# Patient Record
Sex: Male | Born: 1935 | Race: Black or African American | Hispanic: No | Marital: Married | Smoking: Former smoker
Health system: Southern US, Community
[De-identification: ages and names within clinical notes are randomized; demographics above are authoritative.]

## PROBLEM LIST (undated history)

## (undated) DIAGNOSIS — K5900 Constipation, unspecified: Secondary | ICD-10-CM

## (undated) DIAGNOSIS — I2510 Atherosclerotic heart disease of native coronary artery without angina pectoris: Secondary | ICD-10-CM

## (undated) DIAGNOSIS — Z950 Presence of cardiac pacemaker: Secondary | ICD-10-CM

## (undated) DIAGNOSIS — M1990 Unspecified osteoarthritis, unspecified site: Secondary | ICD-10-CM

## (undated) DIAGNOSIS — R350 Frequency of micturition: Secondary | ICD-10-CM

## (undated) DIAGNOSIS — N183 Chronic kidney disease, stage 3 unspecified: Secondary | ICD-10-CM

## (undated) DIAGNOSIS — J449 Chronic obstructive pulmonary disease, unspecified: Secondary | ICD-10-CM

## (undated) DIAGNOSIS — F411 Generalized anxiety disorder: Secondary | ICD-10-CM

## (undated) DIAGNOSIS — N400 Enlarged prostate without lower urinary tract symptoms: Secondary | ICD-10-CM

## (undated) DIAGNOSIS — M549 Dorsalgia, unspecified: Secondary | ICD-10-CM

## (undated) DIAGNOSIS — I10 Essential (primary) hypertension: Secondary | ICD-10-CM

## (undated) DIAGNOSIS — J4489 Other specified chronic obstructive pulmonary disease: Secondary | ICD-10-CM

## (undated) DIAGNOSIS — J309 Allergic rhinitis, unspecified: Secondary | ICD-10-CM

## (undated) DIAGNOSIS — J45909 Unspecified asthma, uncomplicated: Secondary | ICD-10-CM

## (undated) DIAGNOSIS — I469 Cardiac arrest, cause unspecified: Secondary | ICD-10-CM

## (undated) DIAGNOSIS — I442 Atrioventricular block, complete: Secondary | ICD-10-CM

## (undated) DIAGNOSIS — K219 Gastro-esophageal reflux disease without esophagitis: Secondary | ICD-10-CM

## (undated) DIAGNOSIS — E785 Hyperlipidemia, unspecified: Secondary | ICD-10-CM

## (undated) HISTORY — DX: Presence of cardiac pacemaker: Z95.0

## (undated) HISTORY — DX: Benign prostatic hyperplasia without lower urinary tract symptoms: N40.0

## (undated) HISTORY — DX: Hyperlipidemia, unspecified: E78.5

## (undated) HISTORY — DX: Generalized anxiety disorder: F41.1

## (undated) HISTORY — DX: Allergic rhinitis, unspecified: J30.9

## (undated) HISTORY — PX: PACEMAKER INSERTION: SHX728

## (undated) HISTORY — DX: Chronic obstructive pulmonary disease, unspecified: J44.9

## (undated) HISTORY — DX: Atrioventricular block, complete: I44.2

## (undated) HISTORY — PX: CERVICAL FUSION: SHX112

## (undated) HISTORY — DX: Unspecified asthma, uncomplicated: J45.909

## (undated) HISTORY — DX: Chronic kidney disease, stage 3 (moderate): N18.3

## (undated) HISTORY — DX: Unspecified osteoarthritis, unspecified site: M19.90

## (undated) HISTORY — DX: Frequency of micturition: R35.0

## (undated) HISTORY — DX: Chronic kidney disease, stage 3 unspecified: N18.30

## (undated) HISTORY — DX: Constipation, unspecified: K59.00

## (undated) HISTORY — DX: Gastro-esophageal reflux disease without esophagitis: K21.9

## (undated) HISTORY — DX: Other specified chronic obstructive pulmonary disease: J44.89

## (undated) HISTORY — DX: Dorsalgia, unspecified: M54.9

---

## 1998-06-08 ENCOUNTER — Ambulatory Visit (HOSPITAL_COMMUNITY): Admission: RE | Admit: 1998-06-08 | Discharge: 1998-06-08 | Payer: Self-pay | Admitting: Neurosurgery

## 1998-06-08 ENCOUNTER — Encounter: Payer: Self-pay | Admitting: Neurosurgery

## 1998-06-12 ENCOUNTER — Encounter: Payer: Self-pay | Admitting: Neurosurgery

## 1998-06-12 ENCOUNTER — Inpatient Hospital Stay (HOSPITAL_COMMUNITY): Admission: RE | Admit: 1998-06-12 | Discharge: 1998-06-18 | Payer: Self-pay | Admitting: Neurosurgery

## 1998-06-18 ENCOUNTER — Inpatient Hospital Stay (HOSPITAL_COMMUNITY)
Admission: RE | Admit: 1998-06-18 | Discharge: 1998-06-25 | Payer: Self-pay | Admitting: Physical Medicine & Rehabilitation

## 1999-07-24 ENCOUNTER — Encounter: Payer: Self-pay | Admitting: Neurosurgery

## 1999-07-24 ENCOUNTER — Encounter: Admission: RE | Admit: 1999-07-24 | Discharge: 1999-07-24 | Payer: Self-pay | Admitting: Neurosurgery

## 2000-04-22 ENCOUNTER — Encounter: Payer: Self-pay | Admitting: *Deleted

## 2000-04-22 ENCOUNTER — Inpatient Hospital Stay (HOSPITAL_COMMUNITY): Admission: EM | Admit: 2000-04-22 | Discharge: 2000-04-23 | Payer: Self-pay | Admitting: *Deleted

## 2003-12-02 ENCOUNTER — Inpatient Hospital Stay (HOSPITAL_COMMUNITY): Admission: EM | Admit: 2003-12-02 | Discharge: 2003-12-13 | Payer: Self-pay | Admitting: Emergency Medicine

## 2003-12-12 ENCOUNTER — Encounter: Payer: Self-pay | Admitting: Cardiology

## 2004-07-22 ENCOUNTER — Emergency Department (HOSPITAL_COMMUNITY): Admission: EM | Admit: 2004-07-22 | Discharge: 2004-07-22 | Payer: Self-pay | Admitting: Emergency Medicine

## 2004-08-06 ENCOUNTER — Encounter (HOSPITAL_COMMUNITY): Admission: RE | Admit: 2004-08-06 | Discharge: 2004-09-05 | Payer: Self-pay | Admitting: Family Medicine

## 2004-09-11 ENCOUNTER — Encounter (HOSPITAL_COMMUNITY): Admission: RE | Admit: 2004-09-11 | Discharge: 2004-10-11 | Payer: Self-pay | Admitting: Family Medicine

## 2004-10-08 ENCOUNTER — Ambulatory Visit: Payer: Self-pay | Admitting: Internal Medicine

## 2004-11-24 ENCOUNTER — Emergency Department (HOSPITAL_COMMUNITY): Admission: EM | Admit: 2004-11-24 | Discharge: 2004-11-24 | Payer: Self-pay | Admitting: Emergency Medicine

## 2005-03-06 ENCOUNTER — Ambulatory Visit: Payer: Self-pay | Admitting: Cardiology

## 2005-06-19 ENCOUNTER — Encounter (INDEPENDENT_AMBULATORY_CARE_PROVIDER_SITE_OTHER): Payer: Self-pay | Admitting: Family Medicine

## 2005-06-19 LAB — CONVERTED CEMR LAB: PSA: 3.25 ng/mL

## 2005-08-25 ENCOUNTER — Ambulatory Visit: Payer: Self-pay | Admitting: *Deleted

## 2006-02-26 ENCOUNTER — Ambulatory Visit: Payer: Self-pay | Admitting: *Deleted

## 2006-04-23 ENCOUNTER — Ambulatory Visit: Payer: Self-pay | Admitting: Family Medicine

## 2006-05-07 ENCOUNTER — Ambulatory Visit: Payer: Self-pay | Admitting: Family Medicine

## 2006-05-07 LAB — CONVERTED CEMR LAB
RBC count: 4.59 10*6/uL
TSH: 0.857 microintl units/mL
WBC, blood: 3.6 10*3/uL

## 2006-05-12 ENCOUNTER — Ambulatory Visit (HOSPITAL_COMMUNITY): Admission: RE | Admit: 2006-05-12 | Discharge: 2006-05-12 | Payer: Self-pay | Admitting: Family Medicine

## 2006-05-12 ENCOUNTER — Ambulatory Visit: Payer: Self-pay | Admitting: *Deleted

## 2006-05-21 ENCOUNTER — Ambulatory Visit: Payer: Self-pay | Admitting: Family Medicine

## 2006-05-26 ENCOUNTER — Ambulatory Visit: Payer: Self-pay | Admitting: Cardiology

## 2006-06-04 ENCOUNTER — Ambulatory Visit: Payer: Self-pay | Admitting: Family Medicine

## 2006-07-02 ENCOUNTER — Ambulatory Visit: Payer: Self-pay | Admitting: Family Medicine

## 2006-09-11 ENCOUNTER — Encounter: Payer: Self-pay | Admitting: Family Medicine

## 2006-09-11 DIAGNOSIS — K5900 Constipation, unspecified: Secondary | ICD-10-CM | POA: Insufficient documentation

## 2006-09-11 DIAGNOSIS — F411 Generalized anxiety disorder: Secondary | ICD-10-CM | POA: Insufficient documentation

## 2006-09-11 DIAGNOSIS — I10 Essential (primary) hypertension: Secondary | ICD-10-CM | POA: Insufficient documentation

## 2006-09-11 DIAGNOSIS — E785 Hyperlipidemia, unspecified: Secondary | ICD-10-CM

## 2006-09-11 DIAGNOSIS — N400 Enlarged prostate without lower urinary tract symptoms: Secondary | ICD-10-CM | POA: Insufficient documentation

## 2006-09-11 DIAGNOSIS — J309 Allergic rhinitis, unspecified: Secondary | ICD-10-CM | POA: Insufficient documentation

## 2006-09-11 DIAGNOSIS — N183 Chronic kidney disease, stage 3 (moderate): Secondary | ICD-10-CM

## 2006-09-11 DIAGNOSIS — J449 Chronic obstructive pulmonary disease, unspecified: Secondary | ICD-10-CM

## 2006-09-11 DIAGNOSIS — J45909 Unspecified asthma, uncomplicated: Secondary | ICD-10-CM | POA: Insufficient documentation

## 2006-09-11 DIAGNOSIS — J4489 Other specified chronic obstructive pulmonary disease: Secondary | ICD-10-CM | POA: Insufficient documentation

## 2006-09-11 DIAGNOSIS — M1990 Unspecified osteoarthritis, unspecified site: Secondary | ICD-10-CM | POA: Insufficient documentation

## 2006-11-17 ENCOUNTER — Ambulatory Visit: Payer: Self-pay | Admitting: Family Medicine

## 2006-11-17 ENCOUNTER — Telehealth (INDEPENDENT_AMBULATORY_CARE_PROVIDER_SITE_OTHER): Payer: Self-pay | Admitting: Family Medicine

## 2006-11-17 LAB — CONVERTED CEMR LAB
Bilirubin Urine: NEGATIVE
Urobilinogen, UA: 0.2
pH: 7

## 2006-11-18 ENCOUNTER — Encounter (INDEPENDENT_AMBULATORY_CARE_PROVIDER_SITE_OTHER): Payer: Self-pay | Admitting: Family Medicine

## 2006-11-18 LAB — CONVERTED CEMR LAB
ALT: 20 units/L (ref 0–53)
Albumin: 4.6 g/dL (ref 3.5–5.2)
Alkaline Phosphatase: 115 units/L (ref 39–117)
BUN: 20 mg/dL (ref 6–23)
Basophils Relative: 1 % (ref 0–1)
Creatinine, Ser: 1.59 mg/dL — ABNORMAL HIGH (ref 0.40–1.50)
Eosinophils Relative: 7 % — ABNORMAL HIGH (ref 0–5)
Glucose, Bld: 90 mg/dL (ref 70–99)
Hemoglobin: 15 g/dL (ref 13.0–17.0)
Lymphocytes Relative: 31 % (ref 12–46)
Lymphs Abs: 1.2 10*3/uL (ref 0.7–3.3)
MCV: 91.4 fL (ref 78.0–100.0)
Monocytes Relative: 10 % (ref 3–11)
Neutro Abs: 2 10*3/uL (ref 1.7–7.7)
Potassium: 4.5 meq/L (ref 3.5–5.3)
RBC: 5 M/uL (ref 4.22–5.81)
RDW: 14.4 % — ABNORMAL HIGH (ref 11.5–14.0)
Total Bilirubin: 0.7 mg/dL (ref 0.3–1.2)
Total Protein: 8.3 g/dL (ref 6.0–8.3)
WBC: 3.9 10*3/uL — ABNORMAL LOW (ref 4.0–10.5)

## 2006-11-20 ENCOUNTER — Encounter (INDEPENDENT_AMBULATORY_CARE_PROVIDER_SITE_OTHER): Payer: Self-pay | Admitting: Family Medicine

## 2006-11-23 ENCOUNTER — Encounter (INDEPENDENT_AMBULATORY_CARE_PROVIDER_SITE_OTHER): Payer: Self-pay | Admitting: Family Medicine

## 2006-12-01 ENCOUNTER — Ambulatory Visit: Payer: Self-pay | Admitting: Family Medicine

## 2006-12-02 LAB — CONVERTED CEMR LAB: PSA: 2.34 ng/mL (ref 0.10–4.00)

## 2006-12-21 ENCOUNTER — Encounter (INDEPENDENT_AMBULATORY_CARE_PROVIDER_SITE_OTHER): Payer: Self-pay | Admitting: Family Medicine

## 2006-12-28 ENCOUNTER — Encounter (INDEPENDENT_AMBULATORY_CARE_PROVIDER_SITE_OTHER): Payer: Self-pay | Admitting: Family Medicine

## 2007-01-12 ENCOUNTER — Ambulatory Visit: Payer: Self-pay | Admitting: Family Medicine

## 2007-01-12 LAB — CONVERTED CEMR LAB
Cholesterol, target level: 200 mg/dL
HDL goal, serum: 40 mg/dL

## 2007-01-18 ENCOUNTER — Encounter (INDEPENDENT_AMBULATORY_CARE_PROVIDER_SITE_OTHER): Payer: Self-pay | Admitting: Family Medicine

## 2007-01-28 ENCOUNTER — Encounter (INDEPENDENT_AMBULATORY_CARE_PROVIDER_SITE_OTHER): Payer: Self-pay | Admitting: Family Medicine

## 2007-02-02 ENCOUNTER — Encounter (INDEPENDENT_AMBULATORY_CARE_PROVIDER_SITE_OTHER): Payer: Self-pay | Admitting: Family Medicine

## 2007-02-22 ENCOUNTER — Encounter (INDEPENDENT_AMBULATORY_CARE_PROVIDER_SITE_OTHER): Payer: Self-pay | Admitting: Family Medicine

## 2007-02-25 ENCOUNTER — Encounter (INDEPENDENT_AMBULATORY_CARE_PROVIDER_SITE_OTHER): Payer: Self-pay | Admitting: Family Medicine

## 2007-02-25 ENCOUNTER — Ambulatory Visit: Payer: Self-pay | Admitting: Internal Medicine

## 2007-03-09 ENCOUNTER — Ambulatory Visit: Payer: Self-pay | Admitting: Family Medicine

## 2007-03-10 LAB — CONVERTED CEMR LAB
AST: 31 units/L (ref 0–37)
Alkaline Phosphatase: 129 units/L — ABNORMAL HIGH (ref 39–117)
BUN: 21 mg/dL (ref 6–23)
Calcium: 9.7 mg/dL (ref 8.4–10.5)
Eosinophils Absolute: 0.2 10*3/uL (ref 0.0–0.7)
Eosinophils Relative: 7 % — ABNORMAL HIGH (ref 0–5)
Glucose, Bld: 105 mg/dL — ABNORMAL HIGH (ref 70–99)
HCT: 44 % (ref 39.0–52.0)
Hemoglobin: 14.7 g/dL (ref 13.0–17.0)
Lymphocytes Relative: 33 % (ref 12–46)
MCV: 91.5 fL (ref 78.0–100.0)
Monocytes Absolute: 0.3 10*3/uL (ref 0.2–0.7)
Platelets: 169 10*3/uL (ref 150–400)
RBC: 4.81 M/uL (ref 4.22–5.81)
RDW: 14.1 % — ABNORMAL HIGH (ref 11.5–14.0)
Sodium: 139 meq/L (ref 135–145)
Total Bilirubin: 0.4 mg/dL (ref 0.3–1.2)
Total Protein: 8 g/dL (ref 6.0–8.3)

## 2007-03-22 ENCOUNTER — Ambulatory Visit: Payer: Self-pay | Admitting: Family Medicine

## 2007-03-25 ENCOUNTER — Ambulatory Visit: Payer: Self-pay | Admitting: Cardiology

## 2007-03-25 ENCOUNTER — Encounter (INDEPENDENT_AMBULATORY_CARE_PROVIDER_SITE_OTHER): Payer: Self-pay | Admitting: Family Medicine

## 2007-03-26 ENCOUNTER — Encounter (INDEPENDENT_AMBULATORY_CARE_PROVIDER_SITE_OTHER): Payer: Self-pay | Admitting: Family Medicine

## 2007-04-01 ENCOUNTER — Encounter (INDEPENDENT_AMBULATORY_CARE_PROVIDER_SITE_OTHER): Payer: Self-pay | Admitting: Family Medicine

## 2007-04-07 ENCOUNTER — Encounter (INDEPENDENT_AMBULATORY_CARE_PROVIDER_SITE_OTHER): Payer: Self-pay | Admitting: Family Medicine

## 2007-04-14 ENCOUNTER — Ambulatory Visit: Payer: Self-pay | Admitting: Family Medicine

## 2007-04-22 ENCOUNTER — Encounter (INDEPENDENT_AMBULATORY_CARE_PROVIDER_SITE_OTHER): Payer: Self-pay | Admitting: Family Medicine

## 2007-05-03 ENCOUNTER — Encounter (INDEPENDENT_AMBULATORY_CARE_PROVIDER_SITE_OTHER): Payer: Self-pay | Admitting: Family Medicine

## 2007-05-26 ENCOUNTER — Ambulatory Visit: Payer: Self-pay | Admitting: Family Medicine

## 2007-06-02 ENCOUNTER — Encounter (INDEPENDENT_AMBULATORY_CARE_PROVIDER_SITE_OTHER): Payer: Self-pay | Admitting: Family Medicine

## 2007-06-02 ENCOUNTER — Ambulatory Visit: Payer: Self-pay | Admitting: Cardiology

## 2007-06-08 ENCOUNTER — Encounter (INDEPENDENT_AMBULATORY_CARE_PROVIDER_SITE_OTHER): Payer: Self-pay | Admitting: Family Medicine

## 2007-06-09 ENCOUNTER — Ambulatory Visit: Payer: Self-pay | Admitting: Family Medicine

## 2007-06-14 ENCOUNTER — Encounter (INDEPENDENT_AMBULATORY_CARE_PROVIDER_SITE_OTHER): Payer: Self-pay | Admitting: Family Medicine

## 2007-06-24 ENCOUNTER — Ambulatory Visit: Payer: Self-pay | Admitting: Family Medicine

## 2007-07-23 ENCOUNTER — Ambulatory Visit: Payer: Self-pay | Admitting: Family Medicine

## 2007-07-23 DIAGNOSIS — K219 Gastro-esophageal reflux disease without esophagitis: Secondary | ICD-10-CM

## 2007-07-30 ENCOUNTER — Encounter (INDEPENDENT_AMBULATORY_CARE_PROVIDER_SITE_OTHER): Payer: Self-pay | Admitting: Family Medicine

## 2007-08-02 ENCOUNTER — Telehealth (INDEPENDENT_AMBULATORY_CARE_PROVIDER_SITE_OTHER): Payer: Self-pay | Admitting: *Deleted

## 2007-08-04 ENCOUNTER — Encounter (INDEPENDENT_AMBULATORY_CARE_PROVIDER_SITE_OTHER): Payer: Self-pay | Admitting: Family Medicine

## 2007-08-10 ENCOUNTER — Encounter (INDEPENDENT_AMBULATORY_CARE_PROVIDER_SITE_OTHER): Payer: Self-pay | Admitting: Family Medicine

## 2007-08-18 ENCOUNTER — Encounter (INDEPENDENT_AMBULATORY_CARE_PROVIDER_SITE_OTHER): Payer: Self-pay | Admitting: Family Medicine

## 2007-09-14 ENCOUNTER — Encounter (INDEPENDENT_AMBULATORY_CARE_PROVIDER_SITE_OTHER): Payer: Self-pay | Admitting: Family Medicine

## 2007-09-16 ENCOUNTER — Encounter: Payer: Self-pay | Admitting: Family Medicine

## 2007-09-20 ENCOUNTER — Encounter (INDEPENDENT_AMBULATORY_CARE_PROVIDER_SITE_OTHER): Payer: Self-pay | Admitting: Family Medicine

## 2007-09-22 ENCOUNTER — Telehealth (INDEPENDENT_AMBULATORY_CARE_PROVIDER_SITE_OTHER): Payer: Self-pay | Admitting: Family Medicine

## 2007-09-23 ENCOUNTER — Encounter (INDEPENDENT_AMBULATORY_CARE_PROVIDER_SITE_OTHER): Payer: Self-pay | Admitting: Family Medicine

## 2007-10-16 ENCOUNTER — Encounter (INDEPENDENT_AMBULATORY_CARE_PROVIDER_SITE_OTHER): Payer: Self-pay | Admitting: Family Medicine

## 2007-10-18 ENCOUNTER — Ambulatory Visit: Payer: Self-pay | Admitting: Family Medicine

## 2007-11-01 ENCOUNTER — Encounter (INDEPENDENT_AMBULATORY_CARE_PROVIDER_SITE_OTHER): Payer: Self-pay | Admitting: Family Medicine

## 2007-11-02 ENCOUNTER — Encounter (INDEPENDENT_AMBULATORY_CARE_PROVIDER_SITE_OTHER): Payer: Self-pay | Admitting: Family Medicine

## 2007-11-05 ENCOUNTER — Encounter (INDEPENDENT_AMBULATORY_CARE_PROVIDER_SITE_OTHER): Payer: Self-pay | Admitting: Family Medicine

## 2007-11-18 ENCOUNTER — Encounter (INDEPENDENT_AMBULATORY_CARE_PROVIDER_SITE_OTHER): Payer: Self-pay | Admitting: Family Medicine

## 2007-12-15 ENCOUNTER — Encounter (INDEPENDENT_AMBULATORY_CARE_PROVIDER_SITE_OTHER): Payer: Self-pay | Admitting: Family Medicine

## 2007-12-27 ENCOUNTER — Encounter (INDEPENDENT_AMBULATORY_CARE_PROVIDER_SITE_OTHER): Payer: Self-pay | Admitting: Family Medicine

## 2008-01-12 ENCOUNTER — Encounter (INDEPENDENT_AMBULATORY_CARE_PROVIDER_SITE_OTHER): Payer: Self-pay | Admitting: Family Medicine

## 2008-01-17 ENCOUNTER — Ambulatory Visit: Payer: Self-pay | Admitting: Family Medicine

## 2008-01-17 LAB — CONVERTED CEMR LAB
Blood in Urine, dipstick: NEGATIVE
Glucose, Urine, Semiquant: NEGATIVE
Ketones, urine, test strip: NEGATIVE

## 2008-01-18 ENCOUNTER — Encounter (INDEPENDENT_AMBULATORY_CARE_PROVIDER_SITE_OTHER): Payer: Self-pay | Admitting: Family Medicine

## 2008-01-18 ENCOUNTER — Telehealth (INDEPENDENT_AMBULATORY_CARE_PROVIDER_SITE_OTHER): Payer: Self-pay | Admitting: *Deleted

## 2008-01-18 LAB — CONVERTED CEMR LAB
ALT: 13 units/L (ref 0–53)
BUN: 18 mg/dL (ref 6–23)
Calcium: 9.2 mg/dL (ref 8.4–10.5)
Creatinine, Ser: 1.34 mg/dL (ref 0.40–1.50)
Glucose, Bld: 76 mg/dL (ref 70–99)
Potassium: 4.5 meq/L (ref 3.5–5.3)
Sodium: 138 meq/L (ref 135–145)

## 2008-02-08 ENCOUNTER — Encounter (INDEPENDENT_AMBULATORY_CARE_PROVIDER_SITE_OTHER): Payer: Self-pay | Admitting: Family Medicine

## 2008-02-14 ENCOUNTER — Encounter (INDEPENDENT_AMBULATORY_CARE_PROVIDER_SITE_OTHER): Payer: Self-pay | Admitting: Family Medicine

## 2008-02-17 ENCOUNTER — Encounter (INDEPENDENT_AMBULATORY_CARE_PROVIDER_SITE_OTHER): Payer: Self-pay | Admitting: Family Medicine

## 2008-05-15 ENCOUNTER — Ambulatory Visit: Payer: Self-pay | Admitting: Family Medicine

## 2008-05-15 DIAGNOSIS — M549 Dorsalgia, unspecified: Secondary | ICD-10-CM | POA: Insufficient documentation

## 2008-05-15 DIAGNOSIS — R350 Frequency of micturition: Secondary | ICD-10-CM | POA: Insufficient documentation

## 2008-05-15 LAB — CONVERTED CEMR LAB
Bilirubin Urine: NEGATIVE
Glucose, Urine, Semiquant: NEGATIVE
Ketones, urine, test strip: NEGATIVE
Protein, U semiquant: NEGATIVE
Urobilinogen, UA: 0.2
pH: 5.5

## 2008-05-29 ENCOUNTER — Ambulatory Visit: Payer: Self-pay | Admitting: Family Medicine

## 2008-05-30 ENCOUNTER — Encounter (INDEPENDENT_AMBULATORY_CARE_PROVIDER_SITE_OTHER): Payer: Self-pay | Admitting: Family Medicine

## 2008-05-31 LAB — CONVERTED CEMR LAB
AST: 27 units/L (ref 0–37)
Albumin: 4.2 g/dL (ref 3.5–5.2)
Chloride: 105 meq/L (ref 96–112)
Eosinophils Absolute: 0.3 10*3/uL (ref 0.0–0.7)
Eosinophils Relative: 7 % — ABNORMAL HIGH (ref 0–5)
HCT: 43.1 % (ref 39.0–52.0)
Hemoglobin: 13.6 g/dL (ref 13.0–17.0)
Lymphs Abs: 1.3 10*3/uL (ref 0.7–4.0)
MCV: 92.3 fL (ref 78.0–100.0)
Platelets: 165 10*3/uL (ref 150–400)
Potassium: 4.8 meq/L (ref 3.5–5.3)
Sodium: 140 meq/L (ref 135–145)
Total Bilirubin: 0.5 mg/dL (ref 0.3–1.2)
Total Protein: 7.9 g/dL (ref 6.0–8.3)
VLDL: 13 mg/dL (ref 0–40)

## 2008-06-20 ENCOUNTER — Ambulatory Visit: Payer: Self-pay | Admitting: Family Medicine

## 2008-06-27 ENCOUNTER — Encounter (INDEPENDENT_AMBULATORY_CARE_PROVIDER_SITE_OTHER): Payer: Self-pay | Admitting: Family Medicine

## 2008-07-10 ENCOUNTER — Ambulatory Visit: Payer: Self-pay | Admitting: Family Medicine

## 2008-07-10 LAB — CONVERTED CEMR LAB
Glucose, Urine, Semiquant: NEGATIVE
Ketones, urine, test strip: NEGATIVE
Nitrite: NEGATIVE
Specific Gravity, Urine: 1.015
Urobilinogen, UA: 1
WBC Urine, dipstick: NEGATIVE
pH: 7

## 2008-07-17 ENCOUNTER — Encounter (INDEPENDENT_AMBULATORY_CARE_PROVIDER_SITE_OTHER): Payer: Self-pay | Admitting: Family Medicine

## 2008-08-07 ENCOUNTER — Ambulatory Visit: Payer: Self-pay | Admitting: Family Medicine

## 2008-08-29 ENCOUNTER — Ambulatory Visit: Payer: Self-pay | Admitting: Family Medicine

## 2008-10-05 ENCOUNTER — Ambulatory Visit: Payer: Self-pay | Admitting: Cardiology

## 2008-10-10 ENCOUNTER — Telehealth (INDEPENDENT_AMBULATORY_CARE_PROVIDER_SITE_OTHER): Payer: Self-pay | Admitting: *Deleted

## 2008-12-05 ENCOUNTER — Ambulatory Visit: Payer: Self-pay | Admitting: Family Medicine

## 2008-12-06 ENCOUNTER — Encounter (INDEPENDENT_AMBULATORY_CARE_PROVIDER_SITE_OTHER): Payer: Self-pay | Admitting: Family Medicine

## 2008-12-06 LAB — CONVERTED CEMR LAB
ALT: 13 units/L (ref 0–53)
AST: 30 units/L (ref 0–37)
Alkaline Phosphatase: 122 units/L — ABNORMAL HIGH (ref 39–117)
Calcium: 8.8 mg/dL (ref 8.4–10.5)
Creatinine, Ser: 1.5 mg/dL (ref 0.40–1.50)
Sodium: 138 meq/L (ref 135–145)
Total Bilirubin: 0.3 mg/dL (ref 0.3–1.2)

## 2008-12-14 ENCOUNTER — Encounter (INDEPENDENT_AMBULATORY_CARE_PROVIDER_SITE_OTHER): Payer: Self-pay | Admitting: Family Medicine

## 2009-01-04 ENCOUNTER — Encounter (INDEPENDENT_AMBULATORY_CARE_PROVIDER_SITE_OTHER): Payer: Self-pay | Admitting: *Deleted

## 2009-01-11 ENCOUNTER — Encounter: Payer: Self-pay | Admitting: Cardiology

## 2009-03-06 ENCOUNTER — Ambulatory Visit: Payer: Self-pay | Admitting: Family Medicine

## 2009-03-12 DIAGNOSIS — Z950 Presence of cardiac pacemaker: Secondary | ICD-10-CM

## 2009-04-05 ENCOUNTER — Encounter (INDEPENDENT_AMBULATORY_CARE_PROVIDER_SITE_OTHER): Payer: Self-pay | Admitting: Internal Medicine

## 2009-06-05 ENCOUNTER — Ambulatory Visit: Payer: Self-pay | Admitting: Family Medicine

## 2009-06-06 ENCOUNTER — Encounter (INDEPENDENT_AMBULATORY_CARE_PROVIDER_SITE_OTHER): Payer: Self-pay | Admitting: Family Medicine

## 2009-06-07 LAB — CONVERTED CEMR LAB
ALT: 19 units/L (ref 0–53)
Albumin: 4.2 g/dL (ref 3.5–5.2)
BUN: 18 mg/dL (ref 6–23)
Calcium: 9.3 mg/dL (ref 8.4–10.5)
Chloride: 103 meq/L (ref 96–112)
Cholesterol: 124 mg/dL (ref 0–200)
Creatinine, Ser: 1.54 mg/dL — ABNORMAL HIGH (ref 0.40–1.50)
Eosinophils Absolute: 0.3 10*3/uL (ref 0.0–0.7)
Glucose, Bld: 85 mg/dL (ref 70–99)
LDL Cholesterol: 53 mg/dL (ref 0–99)
Lymphocytes Relative: 33 % (ref 12–46)
Monocytes Absolute: 0.4 10*3/uL (ref 0.1–1.0)
Monocytes Relative: 11 % (ref 3–12)
Neutro Abs: 1.8 10*3/uL (ref 1.7–7.7)
Neutrophils Relative %: 47 % (ref 43–77)
Platelets: 154 10*3/uL (ref 150–400)
Potassium: 4.3 meq/L (ref 3.5–5.3)
Total CHOL/HDL Ratio: 2.2

## 2010-01-23 ENCOUNTER — Encounter (INDEPENDENT_AMBULATORY_CARE_PROVIDER_SITE_OTHER): Payer: Self-pay | Admitting: *Deleted

## 2010-03-05 ENCOUNTER — Ambulatory Visit: Payer: Self-pay | Admitting: Internal Medicine

## 2010-06-17 ENCOUNTER — Encounter: Payer: Self-pay | Admitting: Adult Health

## 2010-06-17 ENCOUNTER — Ambulatory Visit: Payer: Self-pay | Admitting: Cardiology

## 2010-07-12 ENCOUNTER — Encounter (INDEPENDENT_AMBULATORY_CARE_PROVIDER_SITE_OTHER): Payer: Self-pay | Admitting: *Deleted

## 2010-09-19 ENCOUNTER — Ambulatory Visit
Admission: RE | Admit: 2010-09-19 | Discharge: 2010-09-19 | Payer: Self-pay | Source: Home / Self Care | Attending: Internal Medicine | Admitting: Internal Medicine

## 2010-10-06 ENCOUNTER — Encounter: Payer: Self-pay | Admitting: Internal Medicine

## 2010-10-13 LAB — CONVERTED CEMR LAB
ALT: 15 units/L (ref 0–53)
Albumin: 4.2 g/dL (ref 3.5–5.2)
Alkaline Phosphatase: 120 units/L — ABNORMAL HIGH (ref 39–117)
Amylase: 102 units/L (ref 0–105)
BUN: 15 mg/dL (ref 6–23)
Creatinine, Ser: 1.36 mg/dL (ref 0.40–1.50)
Glucose, Bld: 89 mg/dL (ref 70–99)
HDL: 53 mg/dL (ref >39)
Lipase: <10 units/L (ref 0–75)
Total Protein: 7.6 g/dL (ref 6.0–8.3)
VLDL: 15 mg/dL (ref 0–40)

## 2010-10-15 NOTE — Miscellaneous (Signed)
Summary: dx correction  Clinical Lists Changes  Problems: Changed problem from PACEMAKER (ICD-V45..01) to PACEMAKER, PERMANENT (ICD-V45.01)  changed the incorrect dx code to correct dx code Donna Keene  July 12, 2010 12:42 PM 

## 2010-10-15 NOTE — Assessment & Plan Note (Signed)
Summary: F3Y   Visit Type:  Follow-up Primary Provider:  Dr.Fanta   History of Present Illness: Douglas Joseph is a friendly 77 y/o AAM with know history of complete heart block s/p pacemaker 11/2003, hypertension, COPD, and CKD stage III.  He is here for 6 month follow-up.  He has no complaints of chest pain, DOE, edema.  He is in good spirits.  Current Medications (verified): 1)  Aspirin 81 Mg Tbec (Aspirin) .... Once Daily 2)  Fexofenadine Hcl 180 Mg Tabs (Fexofenadine Hcl) .... Once Daily 3)  Tiazac 180 Mg  Cp24 (Diltiazem Hcl Er Beads) .... One Daily 4)  Flomax 0.4 Mg Cp24 (Tamsulosin Hcl) .... Two Daily 5)  Metoprolol Tartrate 25 Mg  Tabs (Metoprolol Tartrate) .... 1/2 Pill Two Times A Day 6)  Gabapentin 300 Mg Caps (Gabapentin) .... Once Daily 7)  Spiriva Handihaler 18 Mcg Caps (Tiotropium Bromide Monohydrate) .... One Puff Daily 8)  Celexa 20 Mg Tabs (Citalopram Hydrobromide) .... Once Daily 9)  Proventil Hfa  Aers (Albuterol Sulfate Aers) .... 2 Puffs Every 6 Hours As Needed 10)  Avodart 0.5 Mg Caps (Dutasteride) .... One By Mouth Daily 11)  Omeprazole 20 Mg Cpdr (Omeprazole) .... One Daily 12)  Furosemide 20 Mg Tabs (Furosemide) .... Take 1 Tab Daily 13)  Tramadol-Acetaminophen 37.5-325 Mg Tabs (Tramadol-Acetaminophen) .... Use As Needed  Allergies: No Known Drug Allergies  Comments:  Nurse/Medical Assistant: patient brought med bottles but is no longer taking simvastatin  proventil and spirive and asa at home  Past History:  Past medical, surgical, family and social histories (including risk factors) reviewed, and no changes noted (except as noted below).  Past Medical History: Reviewed history from 03/12/2009 and no changes required. PACEMAKER (ICD-V45.Marland Kitchen01) HYPERTENSION (ICD-401.9) HYPERLIPIDEMIA (ICD-272.4) COPD (ICD-496) FREQUENCY, URINARY (ICD-788.41) BACK PAIN (ICD-724.5) GERD (ICD-530.81) KIDNEY DISEASE, CHRONIC, STAGE III (ICD-585.3) CONSTIPATION NOS  (ICD-564.00) HYPRTRPHY PROSTATE BNG W/O URINARY OBST/LUTS (ICD-600.00) OSTEOARTHRITIS (ICD-715.90) ASTHMA (ICD-493.90) ANXIETY (ICD-300.00) ALLERGIC RHINITIS (ICD-477.9)    Past Surgical History: Reviewed history from 03/06/2009 and no changes required. Disc herniation cervical (Dr. Jennye Moccasin) Pacer left ant chest 2005  Family History: Reviewed history from 12/05/2008 and no changes required. Father: Dead  TB - 69 Mother: Dead TB - 36 Siblings: 21 Siblings - multiple twins. Unsure hx at this time. Kids - Boys 1 - age 42 - healthy, Daughters times 2 - 67 and healthy,  one dead at age 38  -  lung cancer.  Social History: Reviewed history from 03/06/2009 and no changes required. Occupation: Visual merchandiser Retired Married Never Smoked Alcohol use-no Drug use-no Lives with wife and grandson Education: 10 th grade  Review of Systems       All other systems have been reviewed and are negative unless stated above.   Vital Signs:  Patient profile:   77 year old male Weight:      190 pounds BMI:     28.99 Pulse rate:   68 / minute BP sitting:   99 / 79  (right arm)  Vitals Entered By: Dreama Saa, CNA (June 17, 2010 1:10 PM)  Physical Exam  General:  Well developed, well nourished, in no acute distress. Head:  normocephalic and atraumatic Eyes:  PERRLA/EOM intact; conjunctiva and lids normal. Lungs:  Clear bilaterally to auscultation and percussion. Heart:  Distant Heart Sounds without MRG. Abdomen:  Bowel sounds positive; abdomen soft and non-tender without masses, organomegaly, or hernias noted. No hepatosplenomegaly. Msk:  Back normal, normal gait. Muscle strength and tone normal.  Extremities:  No clubbing or cyanosis. Neurologic:  Alert and oriented x 3.   EKG  Procedure date:  06/17/2010  Findings:       Atrium and ventricle are paced.  Rate 70bpm  PPM Specifications Following MD:  Everardo Beals. Juanda Chance, MD     PPM Vendor:  Medtronic     PPM Model Number:  [id number]      PPM Serial Number:  [id number] PPM DOI:  12/11/2003     PPM Implanting MD:  Lewayne Bunting, MD  Lead 1    Location: RA     DOI: 12/11/2003     Model #: [id number]     Serial #: [id number]     Status: active Lead 2    Location: RV     DOI: 12/11/2003     Model #: [id number]     Serial #: [id number]     Status: active  Magnet Response Rate:  BOL 85 ERI 65  Indications:  Complete Heart Block   PPM Follow Up Pacer Dependent:  Yes      Parameters Mode:  DDDR     Lower Rate Limit:  60     Upper Rate Limit:  110 Paced AV Delay:  150     Sensed AV Delay:  120  Impression & Recommendations:  Problem # 1:  HYPERTENSION (ICD-401.9) He is low normal on this visit and is asymptomatic with this. Will make no changes at this time unless he becomes symptomatic. The following medications were removed from the medication list:    Diltiazem Hcl Er Beads 180 Mg Xr24h-cap (Diltiazem hcl er beads) .Marland Kitchen... Take 1 tab daily His updated medication list for this problem includes:    Aspirin 81 Mg Tbec (Aspirin) ..... Once daily    Tiazac 180 Mg Cp24 (Diltiazem hcl er beads) ..... One daily    Metoprolol Tartrate 25 Mg Tabs (Metoprolol tartrate) .Marland Kitchen... 1/2 pill two times a day    Furosemide 20 Mg Tabs (Furosemide) .Marland Kitchen... Take 1 tab daily  Orders: EKG w/ Interpretation (93000)  Problem # 2:  PACEMAKER (ICD-V45.Marland Kitchen01) Will make sure he has a follow-up appointment in the device clinic for [facility] check.  Problem # 3:  COPD (ICD-496) Denies symptoms of increase DOE.  In fact, he states his breathing is better since pacemaker insertion. His updated medication list for this problem includes:    Spiriva Handihaler 18 Mcg Caps (Tiotropium bromide monohydrate) ..... One puff daily    Proventil Hfa Aers (Albuterol sulfate aers) .Marland Kitchen... 2 puffs every 6 hours as needed  Patient Instructions: 1)  Your physician recommends that you schedule a follow-up appointment in: 6 months 2)  Your physician recommends that you continue on your  current medications as directed. Please refer to the Current Medication list given to you today.

## 2010-10-15 NOTE — Letter (Signed)
Summary: Appointment - Reminder 2  [facility] [place]. [address] [place] [address]   Phone: [phone]  Fax: [phone]     Jan 23, 2010 MRN: [id number]   Douglas Joseph [address] [place], [place]  [address]   Dear [name],  Our records indicate that it is time to schedule a follow-up appointment.  Dr.   Ladona Ridgel       recommended that you follow up with [place] in    JANUARY 2011        . It is very important that we reach you to schedule this appointment. We look forward to participating in your health care needs. Please contact us at the number listed above at your earliest convenience to schedule your appointment.  If you are unable to make an appointment at this time, give [place] a call so we can update our records.     Sincerely,   [employer] Scheduling Team [phone]

## 2010-10-15 NOTE — Procedures (Signed)
Summary: PAST DUE FOR F/U PER PT PHONE CALL/TG   Visit Type:  Follow-up Primary Provider:  Dr.Fanta  CC:  no cxardiology complaints.  History of Present Illness: Douglas Joseph returns today for PPM followup.  He is a pleasant 77 yo man with a h/o CHB, and HTN, s/p PPM.  He has not been seen in our office for several years.  He underwent PPM in 2005.  He has gradually slowed down over the years.  He walks with a cane and notes that he gets tired easy.  Despite this he still gets around and denies chest pain and sob or syncope.  Current Medications (verified): 1)  Aspirin 81 Mg Tbec (Aspirin) .... Once Daily 2)  Fexofenadine Hcl 180 Mg Tabs (Fexofenadine Hcl) .... Once Daily 3)  Tiazac 180 Mg  Cp24 (Diltiazem Hcl Er Beads) .... One Daily 4)  Flomax 0.4 Mg Cp24 (Tamsulosin Hcl) .... Two Daily 5)  Metoprolol Tartrate 25 Mg  Tabs (Metoprolol Tartrate) .... 1/2 Pill Two Times A Day 6)  Gabapentin 300 Mg Caps (Gabapentin) .... Once Daily 7)  Simvastatin 40 Mg Tabs (Simvastatin) .... Once Daily 8)  Spiriva Handihaler 18 Mcg Caps (Tiotropium Bromide Monohydrate) .... One Puff Daily 9)  Celexa 20 Mg Tabs (Citalopram Hydrobromide) .... Once Daily 10)  Proventil Hfa  Aers (Albuterol Sulfate Aers) .... 2 Puffs Every 6 Hours As Needed 11)  Avodart 0.5 Mg Caps (Dutasteride) .... One By Mouth Daily 12)  Omeprazole 20 Mg Cpdr (Omeprazole) .... One Daily 13)  Furosemide 20 Mg Tabs (Furosemide) .... Take 1 Tab Daily 14)  Tramadol-Acetaminophen 37.5-325 Mg Tabs (Tramadol-Acetaminophen) .... Use As Needed 15)  Diltiazem Hcl Er Beads 180 Mg Xr24h-Cap (Diltiazem Hcl Er Beads) .... Take 1 Tab Daily 16)  Neurontin 300 Mg Caps (Gabapentin) .... Take 1 Cap Daily  Allergies (verified): No Known Drug Allergies  Past History:  Past Medical History: Last updated: 03/12/2009 PACEMAKER (ICD-V45.Marland Kitchen01) HYPERTENSION (ICD-401.9) HYPERLIPIDEMIA (ICD-272.4) COPD (ICD-496) FREQUENCY, URINARY (ICD-788.41) BACK PAIN  (ICD-724.5) GERD (ICD-530.81) KIDNEY DISEASE, CHRONIC, STAGE III (ICD-585.3) CONSTIPATION NOS (ICD-564.00) HYPRTRPHY PROSTATE BNG W/O URINARY OBST/LUTS (ICD-600.00) OSTEOARTHRITIS (ICD-715.90) ASTHMA (ICD-493.90) ANXIETY (ICD-300.00) ALLERGIC RHINITIS (ICD-477.9)    Past Surgical History: Last updated: 03/06/2009 Disc herniation cervical (Dr. Jennye Moccasin) Pacer left ant chest 2005  Review of Systems  The patient denies chest pain, syncope, dyspnea on exertion, and peripheral edema.    Vital Signs:  Patient profile:   77 year old male Weight:      186 pounds BMI:     28.38 Pulse rate:   91 / minute BP sitting:   106 / 71  (right arm)  Vitals Entered By: Dreama Saa, CNA (March 05, 2010 9:25 AM)  Physical Exam  General:  Alert, oriented and no distress. Hard of hearing. Head:  normocephalic and atraumatic Eyes:  PERRLA/EOM intact; conjunctiva and lids normal. Mouth:  Oral mucosa and oropharynx without lesions or exudates.  Neck:  Neck supple, no JVD. No masses, thyromegaly or abnormal cervical nodes. Chest Wall:  Well healed PPM incision. Lungs:  Fair airation bilaterally. No wheezes or rhonchi. Heart:  RRR with no murmurs rubs, or gallops. Abdomen:  Soft, NT, BS + No obvious mass. No CVA tenderness Pulses:  pulses normal in all 4 extremities Extremities:  No clubbing or cyanosis. Neurologic:  Alert and oriented x 3.   PPM Specifications Following MD:  Everardo Beals. Juanda Chance, MD     PPM Vendor:  [employer]     PPM Model Number:  [id number]     PPM Serial Number:  [id number] PPM DOI:  12/11/2003     PPM Implanting MD:  Lewayne Bunting, MD  Lead 1    Location: RA     DOI: 12/11/2003     Model #: [id number]     Serial #: [id number]     Status: active Lead 2    Location: RV     DOI: 12/11/2003     Model #: 7035     Serial #: [id number]     Status: active  Magnet Response Rate:  BOL 85 ERI 65  Indications:  Complete Heart Block   PPM Follow Up Remote Check?  No Battery Voltage:  2.74 V      Battery Est. Longevity:  2 years     Pacer Dependent:  Yes       PPM Device Measurements Atrium  Amplitude: 5.6 mV, Impedance: 501 ohms, Threshold: 1.25 V at 0.4 msec Right Ventricle  Amplitude: 15.68 mV, Impedance: 518 ohms, Threshold: 1.25 V at 0.4 msec  Episodes MS Episodes:  22     Percent Mode Switch:  <0.1%     Ventricular High Rate:  2     Atrial Pacing:  32.3%     Ventricular Pacing:  99.3%  Parameters Mode:  DDDR     Lower Rate Limit:  60     Upper Rate Limit:  110 Paced AV Delay:  150     Sensed AV Delay:  120 Next Cardiology Appt Due:  08/15/2010 Tech Comments:  RA reprogrammed 2.5 @ 0.4.   Device function normal.  ROV 6 months RDS clinic. Altha Harm, LPN  March 05, 2010 9:44 AM  MD Comments:  Agree with above.  Impression & Recommendations:  Problem # 1:  PACEMAKER (ICD-V45.Marland Kitchen01) His device is working normally.  Will recheck in 6 months as he has about 2 yrs of longevity.  Problem # 2:  HYPERTENSION (ICD-401.9) His blood pressure has been fairly well controlled.  A low sodium diet is requested. His updated medication list for this problem includes:    Aspirin 81 Mg Tbec (Aspirin) ..... Once daily    Tiazac 180 Mg Cp24 (Diltiazem hcl er beads) ..... One daily    Metoprolol Tartrate 25 Mg Tabs (Metoprolol tartrate) .Marland Kitchen... 1/2 pill two times a day    Furosemide 20 Mg Tabs (Furosemide) .Marland Kitchen... Take 1 tab daily    Diltiazem Hcl Er Beads 180 Mg Xr24h-cap (Diltiazem hcl er beads) .Marland Kitchen... Take 1 tab daily  Problem # 3:  HYPERLIPIDEMIA (ICD-272.4) A low fat diet and medical therapy as below are requested. His updated medication list for this problem includes:    Simvastatin 40 Mg Tabs (Simvastatin) ..... Once daily  Patient Instructions: 1)  Your physician recommends that you schedule a follow-up appointment in: 6 months- Paula 2)  1 year Dr. Ladona Ridgel

## 2010-10-15 NOTE — Letter (Signed)
Summary: rsma chart  rsma chart   Imported By: Curtis Sites 06/14/2010 14:21:31  _____________________________________________________________________  External Attachment:    Type:   Image     Comment:   External Document

## 2010-10-17 NOTE — Assessment & Plan Note (Signed)
Summary: 6 month rov   Visit Type:  Follow-up Primary Provider:  Dr.Fanta  CC:  no cardiology complaints.  History of Present Illness: Douglas Joseph is a friendly 77 y/o AAM with know history of complete heart block s/p pacemaker 11/2003, hypertension, COPD, and CKD stage III.  He is here for 6 month follow-up.  He has no complaints of chest pain, DOE, edema.  He is in good spirits. No syncope.  Current Medications (verified): 1)  Aspirin 81 Mg Tbec (Aspirin) .... Once Daily 2)  Fexofenadine Hcl 180 Mg Tabs (Fexofenadine Hcl) .... Once Daily 3)  Tiazac 180 Mg  Cp24 (Diltiazem Hcl Er Beads) .... One Daily 4)  Flomax 0.4 Mg Cp24 (Tamsulosin Hcl) .... Two Daily 5)  Metoprolol Tartrate 25 Mg  Tabs (Metoprolol Tartrate) .... 1/2 Pill Two Times A Day 6)  Gabapentin 300 Mg Caps (Gabapentin) .... Once Daily 7)  Spiriva Handihaler 18 Mcg Caps (Tiotropium Bromide Monohydrate) .... One Puff Daily 8)  Celexa 20 Mg Tabs (Citalopram Hydrobromide) .... Once Daily 9)  Proventil Hfa  Aers (Albuterol Sulfate Aers) .... 2 Puffs Every 6 Hours As Needed 10)  Avodart 0.5 Mg Caps (Dutasteride) .... One By Mouth Daily 11)  Omeprazole 20 Mg Cpdr (Omeprazole) .... One Daily 12)  Furosemide 20 Mg Tabs (Furosemide) .... Take 1 Tab Daily 13)  Tramadol-Acetaminophen 37.5-325 Mg Tabs (Tramadol-Acetaminophen) .... Use As Needed 14)  Celexa 20 Mg Tabs (Citalopram Hydrobromide) .... 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 Jennye Moccasin) Pacer left ant chest 2005  Review of Systems  The patient denies chest pain, syncope, dyspnea on exertion, and peripheral edema.    Vital Signs:  Patient profile:   77 year old male Weight:      187 pounds O2 Sat:      88 % on Room air Pulse rate:   72 / minute BP sitting:   113 / 72  (left arm)  Vitals Entered By: Dreama Saa, CNA (September 19, 2010 9:33 AM)  O2 Flow:  Room air  Physical Exam  General:  Well developed, well nourished, in no acute distress. Head:  normocephalic and atraumatic Eyes:  PERRLA/EOM intact; conjunctiva and lids normal. Neck:  Neck supple, no JVD. No masses, thyromegaly or abnormal cervical nodes. Chest Wall:  Well healed PPM incision. Lungs:  Clear bilaterally to auscultation and percussion. Heart:  Distant Heart Sounds without MRG. Abdomen:  Bowel sounds positive; abdomen soft and non-tender without masses, organomegaly, or hernias noted. No hepatosplenomegaly. Msk:  Back normal, normal gait. Muscle strength and tone normal. Pulses:  pulses normal in all 4 extremities Extremities:  No clubbing or cyanosis. Neurologic:  Alert and oriented x 3.   PPM Specifications Following MD:  Lewayne Bunting, MD     PPM Vendor:  [employer]     PPM Model Number:  [id number]     PPM Serial Number:  [id number] PPM DOI:  12/11/2003     PPM Implanting MD:  Lewayne Bunting, MD  Lead 1    Location: RA     DOI: 12/11/2003     Model #:  [id number]     Serial #: [id number]     Status: active Lead 2    Location: RV     DOI: 12/11/2003     Model #: 6962     Serial #: [id number]     Status: active  Magnet Response Rate:  BOL 85 ERI 65  Indications:  Complete Heart Block   PPM Follow Up Remote Check?  No Battery Voltage:  2.73 V     Battery Est. Longevity:  21 months     Pacer Dependent:  Yes       PPM Device Measurements Atrium  Amplitude: 2.8 mV, Impedance: 508 ohms, Threshold: 0.75 V  at 0.4 msec Right Ventricle  Amplitude: 15.68 mV, Impedance: 504 ohms, Threshold: 0.5 V at 0.4 msec  Episodes MS Episodes:  26     Percent Mode Switch:  <0.1%     Coumadin:  No Ventricular High Rate:  0     Atrial Pacing:  46%     Ventricular Pacing:  99.5%  Parameters Mode:  DDDR     Lower Rate Limit:  60     Upper Rate Limit:  110 Paced AV Delay:  150     Sensed AV Delay:  120 Next Cardiology Appt Due:  03/16/2011 Tech Comments:  No parameter changes.  Device function normal. Longest AHR episode 2 hours, - coumadin.  No Carelink @ this time.  ROV 6 months R[facility] clinic. Altha Harm, LPN  September 19, 2010 9:55 AM  MD Comments:  Agree with above.  Impression & Recommendations:  Problem # 1:  PACEMAKER, PERMANENT (ICD-V45.01) His device is working normally.  Will followup in several months.  Problem # 2:  HYPERTENSION (ICD-401.9) His blood pressure is well controlled. Continue meds as below. His updated medication list for this problem includes:    Aspirin 81 Mg Tbec (Aspirin) ..... Once daily    Tiazac 180 Mg Cp24 (Diltiazem hcl er beads) ..... One daily    Metoprolol Tartrate 25 Mg Tabs (Metoprolol tartrate) .Marland Kitchen... 1/2 pill two times a day    Furosemide 20 Mg Tabs (Furosemide) .Marland Kitchen... Take 1 tab daily  Patient Instructions: 1)  Your physician recommends that you schedule a follow-up appointment in: 6 months for device check and in 1 year with Dr. Ladona Ridgel 2)  Your physician recommends that you continue on your current medications as directed. Please refer to the Current Medication list given to you today.

## 2011-01-28 NOTE — Letter (Signed)
March 25, 2007    Franchot Heidelberg, M.D.  [address]  [place], [place]  [address]   RE:  QUANTAVIUS, HUMM  MRN:  [id number]  /  DOB:  17-Jul-1925   Dear [name]:   Mr. Goh returns to the office for continuing assessment and treatment  of hypertension and dyslipidemia.  He is followed by Dr. Ladona Ridgel for  conduction system disease, having undergone implantation of a dual-  chamber pacemaker in March 2005.  That device was checked in June of  this year and was functioning normally.  The patient has done well since  I last saw him one year ago.  He reports no significant medical  problems.  His wife notes that he has some pedal edema during the day  that resolves at night with nocturia.   Current medications include:  1. Serevent Diskus.  2. Alprazolam 0.25 mg b.i.d. p.r.n.  3. Allegra daily.  4. Aspirin 81 mg daily.  5. Neurontin 300 mg q.i.d.  6. Furosemide 20 mg daily.  7. Simvastatin 40 mg daily.  8. Flomax 0.4 mg daily.  9. Clonazepam 1 mg b.i.d.  10.Metoprolol 12.5 mg b.i.d.  11.Diltiazem 300 mg daily.  12.Citalopram 20 mg daily.  13.Avodart 0.5 mg daily.   Review of labs shows that the patient had a urinary tract infection in  March that was apparently treated successfully with outpatient  antibiotics.  Chemistry profile one week ago was essentially normal  except for minimally elevated creatinine, which has been present in the  past.  The LFTs were likewise normal except for a minimally elevated  alkaline phosphatase.  TSH was normal.   On exam, elderly, pleasant gentleman in no acute distress.  The weight is 210, stable.  Blood pressure 105/65, heart rate 68 and  regular, respirations 16.  NECK:  No jugular venous distention; normal carotid upstrokes without  bruits.  LUNGS:  Clear.  CARDIAC:  Normal first and second heart wound; minimal early systolic  murmur.  ABDOMEN:  Soft and nontender.  No organomegaly; no bruits.  EXTREMITIES:   1+ pretibial edema; normal distal pulses.   IMPRESSION:  Mr. Trawick is doing well with effective medical therapy and  a normally-functioning dual-chamber pacemaker.  A lipid profile will be  obtained.  If results are good, I will see this nice gentleman again in  one year.  Vaccinations are up to date.    Sincerely,      Gerrit Friends. Dietrich Pates, MD, [facility]  Electronically Signed    RMR/MedQ  DD: 03/25/2007  DT: 03/26/2007  Job #: [id number]

## 2011-01-28 NOTE — Letter (Signed)
June 02, 2007    Franchot Heidelberg, M.D.  [address], [address]  [place], [place]  [address]   RE:  LENORRIS, KARGER  MRN:  [id number]  /  DOB:  Apr 01, 1925   Dear [name]:   Mr. Gurganus returns to the office for continued assessment and treatment  of hypertension, dyslipidemia, and conduction system disease.  Since  receiving a pacemaker in March 2005, he has done generally well.  Dyslipidemia has been under excellent control, as documented by a recent  lipid profile.  Blood pressure control has also been quite good.  He has  complained of dyspnea in the past, but not recently.  He has some  fleeting chest discomfort that is very atypical, and does not bother him  much.  He gave up cigarette smoking in 1975.   CURRENT MEDICATIONS:  Include:  1. Klonopin 1 mg b.i.d.  2. Simvastatin 40 mg daily.  3. Furosemide 20 mg daily.  4. Diltiazem 180 mg daily.  5. Flomax 0.8 mg daily.  6. Avodart 0.5 mg daily.  7. Citalopram 20 mg daily.  8. Xanax 0.25 mg b.i.d.  9. Allegra 180 mg daily.  10.Aspirin 81 mg daily.   PHYSICAL EXAMINATION:  A very pleasant gentleman in no acute distress.  The weight is 207, three pounds less than last year.  Blood pressure  120/80.  Heart rate 76 and regular.  Respirations 16.  NECK:  No jugular venous distention.  No carotid bruits.  LUNGS:  Clear.  CARDIAC:  Normal 1st heart sound.  Slightly accentuated 2nd heart sound.  ABDOMEN:  Soft and nontender.  No organomegaly.  EXTREMITIES:  One plus edema.   RECENT LABORATORY:  Notable for normal electrolytes, a creatinine of  1.4, normal LFTs, and an LDL of 53.   IMPRESSION:  Mr. Swopes is doing superbly.  Even his chronic renal  disease is somewhat improved.  We will continue his current medications,  and plan a return visit in 1 year.  His pacemaker was recently assessed,  and is functioning fine.  He will present to your office within the next  few months for influenza vaccine.     Sincerely,      Gerrit Friends. Dietrich Pates, MD, [facility]  Electronically Signed    RMR/MedQ  DD: 06/02/2007  DT: 06/02/2007  Job #: [id number]

## 2011-01-28 NOTE — Assessment & Plan Note (Signed)
[facility]                         ELECTROPHYSIOLOGY OFFICE NOTE   NAME:[name], [name]                        MRN:          [id number]  DATE:02/25/2007                            DOB:          Oct 06, 1924    Douglas Joseph was seen today in the [facility] on February 25, 2007 for  followup of his Medtronic Model number 901, [place].  Date of implant was  December 11, 2003 for complete heart block.   On interrogation of his device today, his battery voltage is 2.77 with  an estimated longevity of 5.5 years.  P waves measured 2.8 to 4.0  millivolts with an atrial capture threshold of 1 volt at 0.4  milliseconds; and an atrial lead impedance of 499 ohms. R waves were not  measured.  He is pacemaker dependent to her a rate of 30 with a  ventricular capture threshold of 0.5 volts at 0.4 milliseconds; and a  ventricular lead impedance of 519 ohms.  There were two modes episodes  noted, totaling less than 0.1% of the time.  He is not on Coumadin  therapy.  He is V-pacing 100% of the time with an AV delay set at 150  and 120 milliseconds.  Capture adaptive is programmed on, no changes  were made in his parameters; and he will send [employer] transmissions in  every three months with a return office visit one year's time.      Douglas Harm, Douglas Joseph  Electronically Signed      Douglas Joseph. Ladona Ridgel, MD  Electronically Signed   PO/MedQ  DD: 02/25/2007  DT: 02/25/2007  Job #: [id number]

## 2011-01-31 NOTE — Op Note (Signed)
NAME:  Douglas Joseph, Douglas Joseph                         ACCOUNT NO.:  [id number]   MEDICAL RECORD NO.:  [id number]                   PATIENT TYPE:  INP   LOCATION:  3735                                 FACILITY:  [facility]   PHYSICIAN:  Charlies Constable, M.D. [facility]              DATE OF BIRTH:  Jul 05, 1925   DATE OF PROCEDURE:  12/11/2003  DATE OF DISCHARGE:                                 OPERATIVE REPORT   CLINICAL HISTORY:  Douglas Joseph is 78 years old and has hypertension and  hyperlipidemia and came to the emergency room with symptoms of shortness of  breath and was found to be in third degree heart block.  His diltiazem was  stopped but he continued to have first and second degree heart block, and he  was transferred here for a pacemaker implantation.  A Foley was attempted to  be placed on the floor but was only partially inserted and the patient had  bleeding at the time of the procedure, and Dr. Retta Diones came in and placed  a new Foley in the catheterization lab prior to the pacemaker insertion.   PROCEDURE:  Implantation of a Medtronic Kappa DDD pacemaker (model number  [id number], serial number [id number]), with a Medtronic bipolar screw-in  ventricular lead (model number 5076-52 cm, serial number [id number]) and a  Medtronic bipolar screw-in atrial lead (model number 5076-45 cm, serial  number [id number]).   INDICATIONS:  Complete heart block with symptoms of shortness of breath.   ANESTHESIA:  1% local Xylocaine.   ESTIMATED BLOOD LOSS:  Less than 20 mL.   COMPLICATIONS:  None.   PROCEDURE:  The procedure was performed in laboratory room #1.  The left  anterior chest was prepped and draped in the usual fashion and a venogram  was performed prior to access of the subclavian vein.  The left anterior  chest was prepped in the usual fashion.  The subcutaneous tissue was  anesthetized with 1% local Xylocaine.  Using an 18-gauge thin-walled needle,  the subclavian vein was entered and access was  secured with a 0.038 wire.  An incision was made below the clavicle and extended to the prepectoral  fascia.  A pocket was made inferior to incision using blunt dissection.  Using two 9 French sheaths, the atrial and ventricular leads were passed to  the right atrium.  A 0.038 wire was left in the subclavian vein for later  access.  A figure-of-eight suture was placed at the entry site for  hemostasis and for later securing the leads.  The ventricular lead was  positioned in the right ventricular apex with good pacing parameters  described below.  The atrial lead was screwed into the right atrial  appendage with good pacing parameters described below.  After removal of the  stylet from the 0.038 wire, the figure-of-eight suture was secured at the  entry site.  The leads were attached to the posterior aspect  of the fossa  with two sutures of 1-0 silk on each Silastic collar.  The pocket was  irrigated with sterile kanamycin solution.  The leads were attached to the  generator with a hex nut wrench and the generator was implanted in the  pocket and secured loosely to the prepectoral fascia with 1-0 silk.  Subcutaneous tissue was closed with running 2-0 Dexon.  The skin was closed  with running 5-0 Dexon.   PACING PARAMETERS:  Atrial unipolar:  P-wave 5.5 mV.  Minimum threshold for  capture 0.2 V.  Resistance 626 Ohms.   Atrial bipolar:  P-wave 4.5 mV.  Minimum threshold for capture 0.6 V.  Resistance 787 Ohms.   Ventricular unipolar:  R-wave 12.8 mV.  Minimum threshold for capture 0.3 V.  Resistance 625 Ohms.   Ventricular bipolar:  R-wave 16.8 mV.  Minimum threshold for capture 1.0 V.  Resistance is 842 Ohms.   There was no pacing in the diaphragm at 10 volts in either lead in the  unipolar or bipolar mode.   The patient tolerated the procedure well and left the laboratory tracking  the atrium and pacing the ventricle.                                               Charlies Constable,  M.D. [facility]    BB/MEDQ  D:  12/11/2003  T:  12/12/2003  Job:  [id number]   cc:   Vida Roller, M.D.  Fax: [phone]   Dirk Dress Katrinka Blazing, M.D.  [address]  [place]  [place] [address]  Fax: [phone]   Cardiopulmonary Lab

## 2011-01-31 NOTE — Letter (Signed)
May 26, 2006     Franchot Heidelberg, MD  [address], [address]  [place], [place]  [address]   RE:  Douglas Joseph, Douglas Joseph  MRN:  [id number]  /  DOB:  1924/10/10   Dear Douglas Joseph:   It was my pleasure assessing Douglas Joseph in the office today at your request.  As you know, this nice gentleman has previously been followed by Douglas Joseph  for conduction system disease requiring placement of a dual chamber  pacemaker in March 2005.  He has had numerous cardiovascular risk factors  including hypertension, dyslipidemia and remote history of cigarette  smoking, but does not have known coronary disease and had a normal cardiac  catheterization in 1990.  He is now referred with increasing dyspnea on  exertion.  Mr. [name] reports that he has had chronic breathlessness with  effort, but this has gotten somewhat worse.  His wife feels that there has  been no substantial change in his symptomatology.  He does not have any  known lung disease, but at a 60 pack-year smoking history at the time he  discontinued tobacco use in 1975.  He has had no severe pneumonias or other  known pulmonary processes.  An echocardiogram was recently performed and  showed normal left ventricular systolic function and no valvular  abnormality.  Chest x-ray in March showed his pacemaker, mild cardiomegaly  and COPD changes.  Recent laboratory is notable for mildly elevated total  CPK with normal MB and troponin, a normal CBC and a normal chemistry profile  except for borderline creatinine.  BNP level was 128.  TSH was normal.   PAST MEDICAL HISTORY:  Notable for degenerative joint disease with prior  cervical laminectomy.   ALLERGIES:  The patient has no known allergies.   RECENT MEDICATIONS:  1. Serevent discus 2 puffs q. day.  2. Alprazolam 0.25 mg b.i.d.  3. Flomax 0.4 mg b.i.d.  4. Allegra 180 mg q. day.  5. Metoprolol 25 mg b.i.d.  6. Neurontin 300 mg q.i.d.  7. Aspirin 81 mg q. day.  8.  Diltiazem 360 mg b.i.d.  9. Furosemide 20 mg q. day.  10.Simvastatin 40 mg q. day.   EXAMINATION:  GENERAL: Pleasant older gentleman in no acute distress.  VITAL SIGNS:  The weight is 209, 8 pounds less than two years ago.  Blood  pressure 100/60.  Heart rate 60 and regular. Respirations 16.  NECK:  No jugular venous distention; normal carotid upstrokes without  bruits.  ENDOCRINE:  No thyromegaly.  HEMATOPOIETIC:  No adenopathy.  SKIN:  No significant lesions.  LUNGS:  Clear.  HEENT:  Non-visualization of the left fundus; mild hypertensive changes on  the right.  CARDIAC:  4th heart sound and modest systolic ejection murmur; normal 1st  and 2nd heart sounds.  ABDOMEN:  Soft, non-tender.  No organomegaly.  EXTREMITIES:  1+ edema; normal distal pulses.  NEUROMUSCULAR:  Symmetric strength and tone; normal cranial nerves.  MUSCULOSKELETAL:  No joint deformities.   A 6-minute walk test was performed.  The patient covered 400 feet.  O2  saturation were made between 90 and 92% at rest and with this level of  exertion.   EKG:  AV sequential pacing.  No change compared with a previous tracing of  May 07, 2006.   IMPRESSION:  1. Douglas Joseph is doing fairly well for his age.  He probably has a      component of chronic obstructive pulmonary disease related to remote  use of tobacco products.  He is sedentary and likely deconditioned.      Despite this, he did fairly well on his 6-minute walk test.  I have      recommended increased daily activity and leg elevation when seated for      him.  Pulmonary function tests will be obtained.  If he shows mild to      moderate abnormalities as expected,  I doubt that further testing will      be required.  I will reassess this nice gentleman in one month.   Sincerely,     Gerrit Friends. Dietrich Pates, MD, [facility]   RMR/MedQ  DD:  05/26/2006 DT:  05/27/2006 Job #:  [id number]

## 2011-01-31 NOTE — Procedures (Signed)
NAME:  LADELL, LEA                         ACCOUNT NO.:  [id number]   MEDICAL RECORD NO.:  [id number]                   PATIENT TYPE:  INP   LOCATION:  IC10                                 FACILITY:  [facility]   PHYSICIAN:  Vida Roller, M.D.                DATE OF BIRTH:  Sep 07, 1925   DATE OF PROCEDURE:  12/04/2003  DATE OF DISCHARGE:                                  ECHOCARDIOGRAM   PRIMARY CARE PHYSICIAN:  Dirk Dress. Katrinka Blazing, M.D.   TAPE NUMBER:  LB [id number], tape count 5873 through 6364.   INDICATION:  A 77 year old male with heart failure.  No previous  echocardiograms.   TECHNICAL QUALITY:  Extremely poor.   RESULTS:  1. Overall, left ventricular systolic function appears to be normal.  2. The patient is in an irregular rhythm with heart block that was difficult     to assess, but I believe the systolic function appears to be reasonably     preserved with an estimated ejection fraction of 50-55%.   ASSESSMENT:  Valvular architecture and function is beyond the limits of this  study.  There does not appear to be an obvious pericardial pathology.  The  right heart appears to be mildly enlarged with mildly-depressed right  ventricular systolic function.      ___________________________________________                                            Vida Roller, M.D.   JH/MEDQ  D:  12/04/2003  T:  12/05/2003  Job:  [id number]

## 2011-01-31 NOTE — Discharge Summary (Signed)
NAME:  Douglas Joseph, Douglas Joseph                         ACCOUNT NO.:  [id number]   MEDICAL RECORD NO.:  [id number]                   PATIENT TYPE:  INP   LOCATION:  A212                                 FACILITY:  [facility]   PHYSICIAN:  Annia Friendly. Loleta Chance, M.D.                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   The patient came to the emergency room at [facility] on December 02, 2003 for evaluation of chest pain x3 days.  The chest pain was described as  sharp associated with tightness of the chest.  The pain was very severe  pertaining to the chest on the morning of admission.  History was also  positive for nausea, shortness of breath, and sweating with chest  discomfort.  The patient was evaluated by the emergency room physician at  [facility] and ER physician concluded that the patient was in third  degree AV block.  History was negative for vomiting, syncope, arm pain and  dizziness.   MEDICAL HISTORY:  Medical history was positive for chronic obstructive  pulmonary disease, benign prostatic hypertrophy, status post TURP; and  osteoarthritis.   MEDICATIONS:  The patient was not allergic to any known medications.   HABITS:  Positive for former use of ethanol x34 years (whiskey and beer,  starting at age 12) and former use of cigarettes x34 years (starting at age  35 smoking 2 packs/day at the time of cessation).   PAST MEDICAL HISTORY:  1. Positive for hospitalization for abdominal pain at [facility] in     his 41s.  2. Hospitalization at [facility] for noncardiac chest discomfort in     August of 2001.  3. Hospitalization for injury to neck and upper back secondary to fall at     work and treated at [facility] with surgery.  4.  Hospitalization for prostate surgery at [facility] by Dr.     __________ secondary to significant urinary weak stream.   FAMILY HISTORY:  1. Mother deceased, age 51, secondary to complications of tuberculosis.  2. Father deceased, age 56, secondary to complications of tuberculosis.  3. Four brothers, deceased:  1 secondary to tuberculosis, 1 brother deceased     secondary to heart disease, and the others cause unknown.  4. Three brothers, living:  Age 27 with a history of heart disease, age 53     good health, and age 61 good health.  5. Four sisters, living:  Age 12 with a history of heart disease, age 19     with a history of copper __________ syndrome, age 64 in good health, and     age 67 health unknown.  6. One son, living, age 18 good health.  7. One son, deceased, age 81 secondary to complications of liver disease.  8. Three daughters, living:  Age 46 good health, age 78 health unknown, age     41 with a history of liver disease.   REVIEW OF SYSTEMS:  Positive for chronic multiple joint pains and stiffness,  poor exercise tolerance.   COURSE IN THE HOSPITAL BY PROBLEMS:  Problem #1:  CARDIAC CONDUCTION  ABNORMALITY, SECOND DEGREE AV BLOCK.  On admission the patient's EKG  demonstrated findings consistent with third degree AV block.  He was held in  the emergency room for 24 hours waiting for transfer to [facility]  for pacemaker.  However, a bed did not become available after 24 hours;  therefore the patient was admitted to ICU for continuation of care.  During  this hospitalization he has been evaluated by [facility].   Chest x-ray on admission was read as cardiomegaly with vascular congestion;  bibasilar atelectasis or scarring (left greater than right).  Significant  labs on admission were as follows:  ABGs on room air on December 02, 2003 at  0947 pH 7.40, pCO2 34.7, pO2 51.7, O2 saturation 87.9%.  White count 5.4,  hemoglobin 13.3, hematocrit 40.0,  platelets 184,000.  Sodium 134, potassium  3.6, chloride 99, CO2 25, glucose 112, BUN 22, creatinine 1.8, calcium 9.0,  total protein 7.9, albumin 3.5.  AST 74, ALT 31, ALP 110, total bilirubin  0.8, magnesium 1.9, serum amylase 163, serum lipase 27.  Total CPK 609,  CK/MB 5.6, troponin I 0.01, TSH 0.636, Free T4 1.16.   Dr. Dorethea Clan of the [facility] Card[facility] indicated that EKG showed third degree  AV block.  Diltiazem was stopped.  The patient was continued on diuretics.  Dr. Dorethea Clan felt that he should be continued on monitoring, waiting to see if  his heart rhythm dysfunction resolved with discontinuation of Cardizem.  He  felt that his congestive heart failure appeared to be diastolic.  Also he  indicated that it might be rate related.  He indicated that echocardiogram  was to be done. He also indicated that he would treat his hypertension with  ACE inhibitor; and electrolytes would be followed closely.  The patient,  according to cardiology demonstrated change from third degree AV block to  second degree AV block.  He indicated that echo was difficulty study, but  left ventricular ejection fraction was preserved.  On December 06, 2003 the  patient had a dobutamine stress Cardiolite study which was read as negative.  Dr. __________ indicated no significant stress induced EKG abnormality due  to the presence of baseline ST segment depression; normal left ventricular  size and normal left ventricular systolic function.  The myocardial  perfusion was normal by scintigraphic imaging.   The patient was discussed with Dr. Sherryl Manges pertaining to his persistent  second-degree AV block.  Arrangements had been made for the patient to be  transported to [facility] for insertion of a pacemaker on December 10, 2003 by .  The patient is not complaining of chest pain, shortness  of breath or dizziness at the time of transfer.  He ambulates in the room without any chest discomfort.   Heart rhythm is irregular.  Heart rhythm  varies between 60s to low-100s.   Problem #2:  RHABDOMYOMYOLOSIS.  Total CPK reached a high of 1857 on December 05, 2003 at 1415.  Last total CPK was 802 on  Mach 25, 2005 at 0610.  Total  series of CPK, CPK/MB and troponin I are as follows:  319, 2005, total CPK  609, CK/MB 5.6, troponin I 0.01.  On December 03, 2003 at 0915 total CPK 1689,  CK/MB 17.8, troponin I 0.02, on December 03, 2003 at 1415:  Total CPK 1857,  CK/MB 15.0, troponin I 0.01; on December 03, 2003 at 2154 total CPK 1712, CK/MB  9.2, troponin I 0.03; December 14, 2003 at 0420 total CPK 1614, CK/MB 7.8,  troponin I 0.03; December 04, 2003 at 1345 total CPK 1465, CK/MB 8.0, troponin  I 0.01; December 08, 2003 at 0610 total CPK 802.  The patient was treated with  slow hydration because of presentation of congestive heart failure at the  time of admission due to cardiac arrhythmia.   Problem #3:  CHRONIC OBSTRUCTIVE PULMONARY DISEASE.  The patient has been  treated with oxygen and other supportive measures such as Serevent metered  dose inhaler 2 puffs at bedtime.  He is not complaining of shortness of  breath at rest or ambulating in the room. Lungs are essentially clear at the  time of discharge.   Problem #4:  ACUTE URINARY TRACT INFECTION SECONDARY TO STAPHYLOCOCCUS  AUREUS.  Urinalysis on admission findings consistent with possible UTI.  Urine culture was done on December 03, 2003 at 1745.  Urine culture grew  staphylococcus species coagulase negative.  The patient has been treated  with doxycycline 100 mg p.o. b.i.d. starting on December 05, 2003.  He is not  complaining of dysuria, gross hematuria, suprapubic pain, fever or chills at  the time of discharge.   Problem #5:  PRERENAL AZOTEMIA.  BUN on admission was 22 and creatinine 1.8.  With progressive diuresis and use of ACE inhibitor the patient's BUN reached  a high of 48 and creatinine 2.1 on December 09, 2003.  It should be noted that  the  patient's IV had also been stopped.  The patient will be restarted on IV  fluid using initially D5W at 50 cc per hour on December 09, 2003.  Labs on  December 10, 2003 revealed the following:  Sodium 125, potassium 3.7, chloride  92, BUN 43, creatinine 2.0.  Fluid had been changed from D5W to normal  saline at 50 cc/hour on December 10, 2003.   Problem #6:  HYPERTENSION.  Blood pressure on admission was 144/69 with a  pulse of 69; on admission to the emergency room.  The patient has been  treated with sodium restriction, Captopril 50 mg p.o. b.i.d.,  hydrochlorothiazide 12.5 mg p.o. daily, furosemide 40 mg p.o. daily; and  other supportive measures.  Blood pressure is 102/64 on December 10, 2003.  Pulse is 93 and irregular.  The patient is able to lie 30 degrees without  problems.  He is observed ambulating in the room without complaining of  chest pain, shortness of breath or dizziness.  Examination of the  extremities demonstrated no edema.  Problem #7:  CHOLELITHIASIS, ASYMPTOMATIC.  The plan is to observe.   Problem #8:  OSTEOARTHRITIS.  The patient is not complaining of any joint  pain, swelling or stiffness. He is treated with Tylenol as needed for any  pain at this time.   Problem #9:  BENIGN PROSTATIC HYPERTROPHY.  Plan is to observe.  PSA ordered  during this hospitalization revealed a value of 5.24 ng/mL (normal 0.10-  4.0).  The patient will follow up with his urologist pertaining to this  problem.  INSTRUCTIONS AT TIME OF DISCHARGE:  1. Diet:  A 4 gram sodium.  2. Activity:  Ambulate in room.   DISCHARGE MEDICATIONS:  1. Serevent metered dose inhaler 2 puffs at bedtime.  2. Neurontin 300 mg p.o. q.i.d.  3. Flomax 0.4 mg p.o. b.i.d.  4. Furosemide 40 mg p.o. daily.  5. Doxycycline 100 mg p.o. b.i.d.  6. Capoten 50 mg p.o. b.i.d.  7. Hydrochlorothiazide 12.5 mg p.o. daily.  8. Zithromax 250 mg p.o. daily x2 days.  9. Atrovent/Xopenex nebulizer treatments q.6h.  10.      IV  normal saline at 50 cc per hour.  11.      Xanax 0.25 mg p.o. t.i.d. p.r.n. for nerves.  12.      Tylenol 650 mg p.o. q.4h. p.r.n. for pain.   FINAL PRIMARY DIAGNOSES:  1. Chest discomfort secondary to cardiac conduction abnormality in lead II     with second-degree AV block.  2. Rhabdomyomyolosis.  3. Prerenal azotemia secondary to over diuresis.  4. Acute urinary tract infection secondary to Staphylococcus aureus.   SECONDARY DIAGNOSES:  1. Chronic obstructive pulmonary disease.  2. Hypertension.  3. Benign prostatic hypertrophy.  4. Chronic anxiety.  5. Asymptomatic cholelithiasis.     ___________________________________________                                         Annia Friendly. Loleta Chance, M.D.   Levonne Hubert  D:  12/10/2003  T:  12/10/2003  Job:  [id number]

## 2011-01-31 NOTE — Consult Note (Signed)
NAME:  Douglas, Joseph                         ACCOUNT NO.:  [id number]   MEDICAL RECORD NO.:  [id number]                   PATIENT TYPE:  INP   LOCATION:  IC10                                 FACILITY:  [facility]   PHYSICIAN:  Vida Roller, M.D.                DATE OF BIRTH:  Apr 08, 1925   DATE OF CONSULTATION:  DATE OF DISCHARGE:                                   CONSULTATION   REFERRING PHYSICIAN:  Dirk Dress. Katrinka Blazing, M.D.   HISTORY OF PRESENT ILLNESS:  Douglas Joseph is a 77 year old male with  hypertension and hyperlipidemia, who presented to the [facility] ER the day  prior to evaluation, complaining of shortness of breath and dyspnea on  exertion.  He was found on EKG to have third degree heart block.  He was  admitted.  His diltiazem was stopped.  He was diuresed.  He is now in second  degree type 1 AV block and is asymptomatic.   PAST MEDICAL HISTORY:  Significant for:  1. Hyperlipidemia.  2. Hypertension.  3. History of cholelithiasis.  4. Benign prostatic hypertrophy.  He has had TURP.  5. Osteoarthritis.  6. He has had cervical disk surgery.  7. He had an adenosine Cardiolite back in August of 2001 which showed no     ischemia and an ejection fraction of 58%.   MEDICATIONS PRIOR TO ADMISSION:  1. Serevent at night.  2. Xanax 0.25 mg three times a day as needed for anxiety.  3. Darvocet-N 100 q.4h. p.r.n. for pain.  4. Docusate 100 mg once a day.  5. Diazide 37.5/25 mg once a day.  6. Lasix 20 mg a day.  7. Neurontin 300 mg four times a day.  8. Flomax 0.4 mg twice a day.  9. Diltiazem 300 mg twice a day.   In the hospital, he is on Serevent, Neurontin, Flovent, and Lasix.   SOCIAL HISTORY:  He lives in [place], [place], with his wife.  He  is retired.  He is married.  He has five children.  He used to smoke  cigarettes back in the 1970s, but quit.  He does not drink any alcohol.  He  does not use any illicit drugs.   FAMILY HISTORY:  His mother died at age 22  of tuberculosis.  His father died  at age 16 of tuberculosis.  He has one brother who is alive at age 18 and  had a bypass surgery in his 73s.   REVIEW OF SYSTEMS:  Generally reviewed and otherwise negative.  He does have  some pleuritic-type discomfort in his chest with dyspnea on exertion, which  is something he has had for a long time and is chronic.  He does have  frequent nocturia and urinary frequency secondary to his TURP and his  prostate problems.   PHYSICAL EXAMINATION:  GENERAL APPEARANCE:  He is a well-developed, well-  nourished, African American  man in no apparent distress.  He is alert and  oriented x 4 and a reasonably poor historian.  VITAL SIGNS:  His temperature is 98.6 degrees, pulse 95 and in a sinus  mechanism with second degree type 1 heart block on EKG, respiratory rate 20,  and blood pressure 154/83.  WEIGHT:  He weighs 199 pounds.  He was 203 pounds when he came into the  hospital, so he is down 4 pounds over the last 24 hours.  HEENT:  Examination is unremarkable, except for arcus senilis.  NECK:  Supple.  He has no jugular venous distention or carotid bruits.  He  does have a well-healed scar secondary to his surgery.  CARDIOVASCULAR:  Nondisplaced point of maximal impulse.  There are no lifts  or thrills.  His first and second heart sounds are normal.  It is mildly  irregular.  LUNGS:  He has decreased breath sounds bilaterally at the bases, but has  pretty good air movement.  ABDOMEN:  Soft and nontender with normoactive bowel sounds.  GENITOURINARY:  Deferred.  RECTAL:  Deferred.  EXTREMITIES:  He has 1+ edema in his bilateral lower extremities.  His  pulses are 1-2+ throughout both upper and lower extremities.  NEUROLOGIC:  Exam is generally nonfocal.   LABORATORY DATA:  The chest x-ray shows cardiomegaly with vascular  congestion.  This was performed on admission.  His initial electrocardiogram  showed sinus rhythm with complete heart block.  The  current  electrocardiogram shows sinus rhythm at a rate of 73 with second degree type  1 heart block and left axis deviation.  QRS duration is 82 msec.  He has no  ischemic ST-T wave changes.   White blood cell count 5.0, hemoglobin 16, hematocrit 47, platelet count  194.  His sodium is 137, potassium 4.1, chloride 100, bicarbonate 25, BUN  17, creatinine 1.3, and blood sugar 82.  Three sets of cardiac enzymes are  not consistent with acute myocardial infarction.  His liver function studies  were not performed.  His D-dimer was 0.48.  No B type natruretic peptide was  obtained.  Coagulation studies were also not obtained.  So we have a  gentleman who has third degree heart block.  It appears to be secondary to  his Cardizem.  He is on a reasonably high dose and advanced age and probably  has some underlying conduction disease, however, it has been less than 24  hours since the Cardizem.  So we will monitor him over the period of the  next 48 hours to see what his heart rhythm eventually shows off of the  Cardizem.  His congestive heart failure appears to be diastolic.  Potentially it might be rate related.  It might be systolic, we do not know.  We are going to check and echocardiogram and see.  Will hold all of his AV  nodal blockers.  Will treat his hypertension with an ACE inhibitor  currently.  Will follow his electrolytes.  We will also check his thyroid  function study.      ___________________________________________                                            Vida Roller, M.D.   JH/MEDQ  D:  12/04/2003  T:  12/04/2003  Job:  [id number]

## 2011-01-31 NOTE — Consult Note (Signed)
NAME:  Douglas Joseph, Douglas Joseph                         ACCOUNT NO.:  [id number]   MEDICAL RECORD NO.:  [id number]                   PATIENT TYPE:  INP   LOCATION:  3735                                 FACILITY:  [facility]   PHYSICIAN:  Bertram Millard. Dahlstedt, M.D.          DATE OF BIRTH:  10/08/1924   DATE OF CONSULTATION:  12/11/2003  DATE OF DISCHARGE:                                   CONSULTATION   BRIEF HISTORY:  This 77 year old male was undergoing pacemaker implantation  by Dr. Juanda Chance.  While he was being prepped for this procedure, the patient  was noted to have blood coming around his Foley catheter which had been  placed earlier in the day.  Additionally, there was blood in the catheter  bag.  The patient was having penile and pelvic discomfort.  Urologic  consultation was requested.   The patient apparently has a history of BPH and a TURP.  He does not  remember having this procedure; however, it is documented in his medical  records.  The patient denies having a problem voiding.  He says that before  the catheter was placed, he was voiding fairly well.  I was unable to  adequately question the patient as he was about to undergo this pacemaker  implantation.  Additionally, his family was not available.   PHYSICAL EXAMINATION:  GU:  Exam revealed the patient's phallus to be normal  in appearance except for blood coming from the meatus.  There were no  lesions.  Scrotal skin was unremarkable.  Testicles were atrophic but  otherwise normal.   PROCEDURE:  The patient's penis was sterilely prepped and draped.  I  instilled 15 ml of 2% viscous lidocaine in his urethra.  I then placed an 66-  French Foley catheter.  This went with a bit of difficulty at the bladder  neck area.  This is consistent with a small bladder neck contracture.  Over  800 ml of urine was obtained.  It was fairly clear.   IMPRESSION:  1. Difficult catheterization.  2. 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  3. Urethral  injury.   PLAN:  1. Will recommend that we leave the catheter in about 24 hours.  2. It should be discontinue in the morning of the day of the voiding trial.  3. I would expect that the patient should be able to void adequately once he     gets on his feet.  4. He will either need to follow up with me or a urologist up in [place].                                               Bertram Millard. Dahlstedt, M.D.    SMD/MEDQ  D:  12/12/2003  T:  12/12/2003  Job:  [id number]   cc:  Learta Codding, M.D. [facility]

## 2011-01-31 NOTE — Discharge Summary (Signed)
[place]  Patient:    Douglas Joseph, Douglas Joseph                      MRN: [id number] Adm. Date:  [id number] Disc. Date: [id number] Attending:  Junious Silk Dictator:   Joellyn Rued, P.A.C. CC:         Dr. Nobie Putnam, [place], [place]  Bea Laura Graceann Congress, M.D. [facility]   Discharge Summary  HISTORY OF PRESENT ILLNESS:  Douglas Joseph is a 77 year old black male who presented to the emergency room complaining of left-sided chest numbness and a sharp pain.  He said he also noticed these that week and they occur at rest and occasionally with shortness of breath, nausea, and diaphoresis.  He does describe dyspnea on exertion, however, he can walk with a walker only.  This morning the discomfort lasted about five minutes and he felt that it was relieved by hitting himself on the chest.  Supposedly he had a last catheterization 10 or 15 years ago, results are unknown.  He was sent over from Dr. Lars Pinks office with EKG changes inferiorly.  His history is also notable for neck pain, questionable osteoarthritis, hypertension, cholesterol status unknown, cholelithiasis.  LABORATORY DATA:  Fasting lipids showed a total cholesterol of 190, triglycerides 166, HDL 43, LDL 114, with a ratio of 4.4.  CKs and troponins were negative for myocardial infarction.  Admission H&H was 13.5, 40.3, normal indices, and platelets 193, WBC 3.9.  PT 13.6 and PTT 34, sodium 138, potassium 4.6, BUN 19, creatinine 1.3, glucose 95.  His alkaline phosphatase and SGOT were slightly elevated at 126 and 42.  His lipase was 23, amylase was elevated at 191.  Chest x-ray showed cardiomegaly with chronic lung disease.  EKG showed normal sinus rhythm with a first degree block with a PR interval from 280 to 350, left axis deviation, nonspecific ST T wave changes.  HOSPITAL COURSE:  Douglas Joseph was admitted to 6500.  Overnight, he did not have any further complaints.  EKGs and enzymes were negative  for myocardial infarction.  It was noted that his alkaline phosphatase, SGOT, and his amylase were elevated and he was started Protonix.  An Adenosine Cardiolite was performed without difficulty.  Imaging showed an EF of 57%, no signs of ischemia or scarring and it was felt that he could be discharged home.  DISCHARGE DIAGNOSES: 1. Noncardiac chest discomfort. 2. Partial abnormal liver function tests and elevated amylase. 3. History as previously described.  DISPOSITION:  He is discharged home and asked to continue his home medications.  DISCHARGE MEDICATIONS: 1. Coated aspirin 325 mg q.d. 2. Neurontin 100 mg q.h.s. 3. Flomax 0.4 at bedtime. 4. Cardizem 90 q.d. 5. Darvocet as needed.  He received a new prescription for Protonix 40 q.d.  He was asked to maintain a low salt, fat, and cholesterol diet.  He is to make follow-up arrangements to see Dr. Nobie Putnam for consideration of GI evaluation in regards to his abnormal LFTs and amylase. DD:  04/23/00 TD:  04/23/00 Job: [id number]

## 2011-01-31 NOTE — Procedures (Signed)
NAME:  Douglas Joseph, Douglas Joseph               ACCOUNT NO.:  [id number]   MEDICAL RECORD NO.:  [id number]          PATIENT TYPE:  OUT   LOCATION:  RAD                           FACILITY:  [facility]   PHYSICIAN:  Farris Has. Dorethea Clan, MD  DATE OF BIRTH:  [birth year]   DATE OF PROCEDURE:  05/12/2006  DATE OF DISCHARGE:                                  ECHOCARDIOGRAM   TAPE NUMBER:  [id number]   TAPE COUNT:  [id number] through 6141.   This is a 77 year old man with dyspnea, history of a pacemaker.   Technical quality of the study is adequate.   M-MODE TRACINGS:  The aorta is 32 mm.   Left atrium is 36 mm.   Septum is 14 mm.   Posterior wall is 12 mm.   Left ventricular diastolic dimension is 44 mm.   Left ventricular systolic dimension is 30 mm.   2-D AND DOPPLER IMAGING:  The left ventricle is normal size.  There is  preserved LV systolic function.  Estimated ejection fraction 55-60%.  The  septum moves in a pattern consistent with a bundle branch block or a  pacemaker.  There is mild concentric left ventricular hypertrophy.   The right ventricle is normal size with normal systolic function.  There is  a pacemaker that traverses the right atrium and the right ventricle.   The left atrium appears to be normal size.  The right atrium is slightly  dilated.   The aortic valve is sclerotic with no stenosis or regurgitation.   The mitral valve has mild linear calcification with trace regurgitation.   The tricuspid valve has mild to moderate regurgitation.   There is no pericardial effusion.   ASSESSMENT:  There is interval increase in LV systolic function from  previous echo in March of 2005.      Farris Has. Dorethea Clan, MD  Electronically Signed     JMH/MEDQ  D:  05/12/2006  T:  05/13/2006  Job:  [id number]   cc:   Alvino Chapel, Dr.

## 2011-01-31 NOTE — Discharge Summary (Signed)
NAME:  Douglas Joseph, Douglas Joseph                         ACCOUNT NO.:  [id number]   MEDICAL RECORD NO.:  [id number]                   PATIENT TYPE:  INP   LOCATION:  3735                                 FACILITY:  [facility]   PHYSICIAN:  Learta Codding, M.D. [facility]             DATE OF BIRTH:  1924/09/16   DATE OF ADMISSION:  12/10/2003  DATE OF DISCHARGE:  12/13/2003                                 DISCHARGE SUMMARY   PRIMARY DIAGNOSIS:  Bradycardia.   SECONDARY DIAGNOSES:  1. Dyslipidemia.  2. Hypertension.  3. Hematuria.  4. Cervical disk surgery.   HISTORY OF PRESENT ILLNESS:  This is a 77 year old gentleman with a history  of hypertension and dyslipidemia, who presented to the emergency room at  [facility] for complaint of shortness of breath and dyspnea on exertion.  He  was found on EKG to be in third-degree heart block.  He was admitted.  His  diltiazem was stopped and he was diuresed.  The patient was then in a second-  degree type I A-V block and was asymptomatic.   PAST MEDICAL HISTORY:  Past medical history is positive for:  1. Hyperlipidemia.  2. Hypertension.  3. Cholelithiasis.  4. BPH, status post TURP.  5. Osteoarthritis.  6. Cervical disk surgery.  7. Adenosine Cardiolite, August 2001, which showed no ischemia and an EF of     58%.   MEDICATIONS PRIOR TO ADMISSION:  1. Serevent at night.  2. Xanax 0.25 mg 3 times a day as needed.  3. Darvocet-N every 4 hours p.r.n. for pain.  4. Docusate 100 mg a day.  5. Dyazide 37.5/25 mg a day.  6. Lasix 20 mg a day.  7. Neurontin 300 mg 4 times daily.  8. Flomax 0.4 mg b.i.d.  9. Diltiazem 300 mg b.i.d.   HOSPITAL COURSE:  The patient was originally admitted to [facility]  and also had symptoms of an upper respiratory infection.  He was placed on  doxycycline, which he had taken from December 05, 2003 to December 13, 2003, as  well as azithromycin, which was also started on the 24th; the patient's last  dose will be today, the  day of discharge.  The patient was admitted and  underwent a traumatic Foley insertion.  He was noted to have hematuria and  underwent a GU consult.  GU saw the patient, replaced the Foley and the  patient's urine gradually improved, Foley was discontinued and the patient's  urine remained clear.  He underwent placement of a dual-chamber Medtronic  pacemaker, tolerated the procedure well, had no immediate postop  complications.  The following day, his values were within normal limits.  The patient was complaining of some dizziness.  He was noted to be tracking  at rates of 116, 108.  He was started on his beta blocker, on Lopressor 25  mg b.i.d., and had no further episodes of upper rate  tracking.  The patient  was also restarted on his Tiazac 300 mg to be decreased daily, since he has  started on Lopressor, and blood pressure to be evaluated in the office.   DISCHARGE MEDICATIONS:  1. The patient was then discharged to home on the following new medications:     Lopressor 25 mg b.i.d.  2. His other medications are the same as before:     a. Coated aspirin 81 mg daily.     b. Serevent 50 mcg 1 puff nightly.     c. Dyazide 37.5/25 mg daily.     d. Neurontin 300 mg 4 times daily.     e. Flomax 0.4 mg b.i.d.     f. Xanax 0.25 mg t.i.d. as needed.     g. Darvocet-N 100 every 4 hours as needed.     h. Colace 100 mg a day.        a. Tiazac 300 mg decreased to once a day from b.i.d., which will need           to be readjusted according to his blood pressure.  3. The patient was to stop his Lasix, this also to be reevaluated upon     office visit.  4. He was discharged on Tylenol 1-2 tabs every 4-6 hours as needed.   ACTIVITY AND WOUND CARE:  Activity and wound care were per pacemaker  discharge sheet.  The patient does not drive.  He was not allowed to shower  for 1 week.   FOLLOWUP:  He was scheduled to follow up in the [place] office with Jae Dire, physician assistant, December 27, 2003 at 2 p.m., and Dr. Doylene Canning.  Ladona Ridgel on April 09, 2004 at 2:15 p.m. in the [place] office for followup  of his pacemaker.      Chinita Pester, C.R.N.P. [facility]                 Learta Codding, M.D. [facility]    DS/MEDQ  D:  12/13/2003  T:  12/14/2003  Job:  [id number]   cc:   Charlies Constable, M.D. [facility]   Vida Roller, M.D.  Fax: [phone]   Jae Dire, P.A. [facility]   Dirk Dress. Katrinka Blazing, M.D.  [address]  [place]  [place] [address]  Fax: [phone]

## 2011-05-09 ENCOUNTER — Encounter: Payer: Self-pay | Admitting: *Deleted

## 2011-05-09 ENCOUNTER — Emergency Department (HOSPITAL_COMMUNITY)
Admission: EM | Admit: 2011-05-09 | Discharge: 2011-05-09 | Disposition: A | Discharge status: Home / Self Care | Payer: Medicare Other | Attending: Emergency Medicine | Admitting: Emergency Medicine

## 2011-05-09 DIAGNOSIS — I10 Essential (primary) hypertension: Secondary | ICD-10-CM | POA: Insufficient documentation

## 2011-05-09 DIAGNOSIS — I2510 Atherosclerotic heart disease of native coronary artery without angina pectoris: Secondary | ICD-10-CM | POA: Insufficient documentation

## 2011-05-09 DIAGNOSIS — Z950 Presence of cardiac pacemaker: Secondary | ICD-10-CM | POA: Insufficient documentation

## 2011-05-09 DIAGNOSIS — K5641 Fecal impaction: Secondary | ICD-10-CM | POA: Insufficient documentation

## 2011-05-09 DIAGNOSIS — Z87891 Personal history of nicotine dependence: Secondary | ICD-10-CM | POA: Insufficient documentation

## 2011-05-09 HISTORY — DX: Benign prostatic hyperplasia without lower urinary tract symptoms: N40.0

## 2011-05-09 HISTORY — DX: Essential (primary) hypertension: I10

## 2011-05-09 HISTORY — DX: Atherosclerotic heart disease of native coronary artery without angina pectoris: I25.10

## 2011-05-09 NOTE — ED Notes (Signed)
Pt up to bsc

## 2011-05-09 NOTE — ED Notes (Signed)
Pt c/o constipation. States that his last BM was last Sunday. Pt states that he has been picking out chunks but it has not helped. Denies pain.

## 2011-05-09 NOTE — ED Notes (Signed)
Pt had large bm and says feels better.  NOtified Dr. Rosalia Hammers

## 2011-05-09 NOTE — ED Provider Notes (Addendum)
History     CSN: [id number] Arrival date & time: 05/09/2011  3:39 PM  Chief Complaint  Patient presents with  . Constipation   Patient is a 77 y.o. male presenting with constipation. The history is provided by the patient and the spouse.  Constipation  The current episode started 3 to 5 days ago. The problem occurs continuously. The problem has been unchanged. The patient is experiencing no pain. The stool is described as hard. There was no prior successful therapy. Prior unsuccessful therapies include laxatives and stool softeners. Associated symptoms include anorexia and rectal pain. Pertinent negatives include no fever, no abdominal pain, no diarrhea, no hematemesis, no hemorrhoids, no vomiting, no hematuria, no chest pain, no headaches, no coughing, no difficulty breathing and no rash.    Past Medical History  Diagnosis Date  . Prostate enlargement   . Hypertension   . Coronary artery disease     Past Surgical History  Procedure Date  . Pacemaker insertion     History reviewed. No pertinent family history.  History  Substance Use Topics  . Smoking status: Former Games developer  . Smokeless tobacco: Not on file  . Alcohol Use: No      Review of Systems  Constitutional: Negative for fever.  Respiratory: Negative for cough.   Cardiovascular: Negative for chest pain.  Gastrointestinal: Positive for constipation, rectal pain and anorexia. Negative for vomiting, abdominal pain, diarrhea, hematemesis and hemorrhoids.  Genitourinary: Negative for hematuria.  Skin: Negative for rash.  Neurological: Negative for headaches.  All other systems reviewed and are negative.    Physical Exam  BP 109/62  Pulse 78  Temp(Src) 97.6 F (36.4 C) (Oral)  Resp 20  Ht 5\' 6"  (1.676 m)  Wt 186 lb (84.369 kg)  BMI 30.02 kg/m2  SpO2 96%  Physical Exam  Vitals reviewed. Constitutional: He appears well-developed and well-nourished.  HENT:  Head: Normocephalic.  Eyes: Conjunctivae are  normal. Pupils are equal, round, and reactive to light.  Neck: Normal range of motion.  Cardiovascular: Normal rate, regular rhythm, normal heart sounds and intact distal pulses.   Pulmonary/Chest: Effort normal and breath sounds normal.  Abdominal: Soft. Bowel sounds are normal.       Rectum with firm stool  Musculoskeletal: Normal range of motion.  Neurological: He is alert. He has normal reflexes.  Psychiatric: He has a normal mood and affect.    ED Course  Procedures Patient with digital disimpaction of moderate amount of stool followed by spontaneous evacuation of rectum. MDM Patient improved and wants to go home.     Douglas Quarry, MD 05/09/11 1734  Douglas Quarry, MD 05/10/11 1600

## 2011-05-13 ENCOUNTER — Encounter: Payer: Self-pay | Admitting: *Deleted

## 2011-07-03 ENCOUNTER — Encounter (HOSPITAL_COMMUNITY): Payer: Self-pay | Admitting: Emergency Medicine

## 2011-07-03 ENCOUNTER — Emergency Department (HOSPITAL_COMMUNITY)
Admission: EM | Admit: 2011-07-03 | Discharge: 2011-07-03 | Disposition: A | Discharge status: Home / Self Care | Payer: Medicare Other | Attending: Emergency Medicine | Admitting: Emergency Medicine

## 2011-07-03 DIAGNOSIS — Z950 Presence of cardiac pacemaker: Secondary | ICD-10-CM | POA: Insufficient documentation

## 2011-07-03 DIAGNOSIS — K4090 Unilateral inguinal hernia, without obstruction or gangrene, not specified as recurrent: Secondary | ICD-10-CM | POA: Insufficient documentation

## 2011-07-03 DIAGNOSIS — N400 Enlarged prostate without lower urinary tract symptoms: Secondary | ICD-10-CM | POA: Insufficient documentation

## 2011-07-03 DIAGNOSIS — Z87891 Personal history of nicotine dependence: Secondary | ICD-10-CM | POA: Insufficient documentation

## 2011-07-03 DIAGNOSIS — I10 Essential (primary) hypertension: Secondary | ICD-10-CM | POA: Insufficient documentation

## 2011-07-03 DIAGNOSIS — N390 Urinary tract infection, site not specified: Secondary | ICD-10-CM

## 2011-07-03 DIAGNOSIS — I2510 Atherosclerotic heart disease of native coronary artery without angina pectoris: Secondary | ICD-10-CM | POA: Insufficient documentation

## 2011-07-03 LAB — URINALYSIS, ROUTINE W REFLEX MICROSCOPIC
Bilirubin Urine: NEGATIVE
Glucose, UA: NEGATIVE mg/dL
Ketones, ur: NEGATIVE mg/dL
Nitrite: POSITIVE — AB
Protein, ur: NEGATIVE mg/dL
Specific Gravity, Urine: 1.01 (ref 1.005–1.030)
Urobilinogen, UA: 0.2 mg/dL (ref 0.0–1.0)
pH: 5.5 (ref 5.0–8.0)

## 2011-07-03 LAB — URINE MICROSCOPIC-ADD ON

## 2011-07-03 MED ORDER — SULFAMETHOXAZOLE-TRIMETHOPRIM 800-160 MG PO TABS: 1.0000 | ORAL_TABLET | Freq: Two times a day (BID) | ORAL | Status: AC

## 2011-07-03 MED ORDER — SULFAMETHOXAZOLE-TMP DS 800-160 MG PO TABS
1.0000 | ORAL_TABLET | Freq: Once | ORAL | Status: AC
  Administered 2011-07-03: 1 via ORAL
  Filled 2011-07-03: qty 1

## 2011-07-03 NOTE — ED Provider Notes (Signed)
History   This chart was scribed for Raeford Razor, MD by Clarita Crane. The patient was seen in room APA09/APA09 and the patient's care was started at 12:57PM.   CSN: [id number] Arrival date & time: 07/03/2011 12:22 PM   First MD Initiated Contact with Patient 07/03/11 1247      Chief Complaint  Patient presents with  . Dysuria  . Abscess    HPI Douglas Joseph is a 77 y.o. male who presents to the Emergency Department complaining of constant dysuria described as burning with associated urinary urgency 2 weeks ago and persistent since. Denies fever, chills, nausea, vomiting. Patient also with additional complaint of protruding area to left inguinal region with associated pain. Patient with h/o hypertension, CAD, prostate enlargement.    Past Medical History  Diagnosis Date  . Prostate enlargement   . Hypertension   . Coronary artery disease     Past Surgical History  Procedure Date  . Pacemaker insertion     History reviewed. No pertinent family history.  History  Substance Use Topics  . Smoking status: Former Games developer  . Smokeless tobacco: Not on file  . Alcohol Use: No      Review of Systems 10 Systems reviewed and are negative for acute change except as noted in the HPI.  Allergies  Review of patient's allergies indicates no known allergies.  Home Medications   Current Outpatient Rx  Name Route Sig Dispense Refill  . CITALOPRAM HYDROBROMIDE 20 MG PO TABS Oral Take 20 mg by mouth daily.      Marland Kitchen DILTIAZEM HCL COATED BEADS 180 MG PO CP24 Oral Take 180 mg by mouth daily.      . DUTASTERIDE 0.5 MG PO CAPS Oral Take 0.5 mg by mouth daily.      . FUROSEMIDE 20 MG PO TABS Oral Take 20 mg by mouth daily. For fluid     . GABAPENTIN 300 MG PO CAPS Oral Take 300 mg by mouth daily.      Marland Kitchen METOPROLOL TARTRATE 25 MG PO TABS Oral Take 12.5 mg by mouth 2 (two) times daily.      Marland Kitchen OMEPRAZOLE 20 MG PO CPDR Oral Take 20 mg by mouth daily. For acid reflux     . SENNOSIDES-DOCUSATE  SODIUM 8.6-50 MG PO TABS Oral Take 1 tablet by mouth at bedtime.     . TAMSULOSIN HCL 0.4 MG PO CAPS Oral Take 0.8 mg by mouth daily.      . TRAMADOL HCL 50 MG PO TABS Oral Take 50 mg by mouth 2 (two) times daily. Pain     . SULFAMETHOXAZOLE-TRIMETHOPRIM 800-160 MG PO TABS Oral Take 1 tablet by mouth every 12 (twelve) hours. 10 tablet 0  . TRAMADOL-ACETAMINOPHEN 37.5-325 MG PO TABS Oral Take 1 tablet by mouth 2 (two) times daily.        BP 133/72  Pulse 68  Temp(Src) 98.1 F (36.7 C) (Oral)  Resp 14  Ht 5\' 6"  (1.676 m)  Wt 175 lb (79.379 kg)  BMI 28.25 kg/m2  SpO2 99%  Physical Exam  Nursing note and vitals reviewed. Constitutional: He is oriented to person, place, and time. He appears well-developed and well-nourished. No distress.  HENT:  Head: Normocephalic and atraumatic.  Eyes: EOM are normal.  Neck: Neck supple. No tracheal deviation present.  Cardiovascular: Normal rate and regular rhythm.   No murmur heard. Pulmonary/Chest: Effort normal. No respiratory distress. He has no wheezes. He has no rales.  Abdominal: Soft. He exhibits no  distension. There is no tenderness. There is no CVA tenderness.  Genitourinary: Right testis shows no tenderness. Left testis shows no tenderness. Circumcised.       Left inguinal hernia present which is easily reducible. Chaperone present for genital exam.   Musculoskeletal: Normal range of motion. He exhibits no edema.  Neurological: He is alert and oriented to person, place, and time. No sensory deficit.  Skin: Skin is warm and dry.  Psychiatric: He has a normal mood and affect. His behavior is normal.    ED Course  Procedures (including critical care time)  DIAGNOSTIC STUDIES: Oxygen Saturation is 99% on room air, normal by my interpretation.    COORDINATION OF CARE:    Labs Reviewed  URINALYSIS, ROUTINE W REFLEX MICROSCOPIC - Abnormal; Notable for the following:    Hgb urine dipstick TRACE (*)    Nitrite POSITIVE (*)     Leukocytes, UA SMALL (*)    All other components within normal limits  URINE MICROSCOPIC-ADD ON - Abnormal; Notable for the following:    Bacteria, UA MANY (*)    All other components within normal limits   No results found.   1. Urinary tract infection   2. Inguinal hernia       MDM  86yM with dysuria. UA consistent with uti. Easily reducible L inguinal hernia. Clinically well appearing. Plan course of abx and outpt f/u.      I personally preformed the services scribed in my presence. The recorded information has been reviewed and considered. Raeford Razor, MD.    Raeford Razor, MD 07/08/11 [phone]

## 2011-07-03 NOTE — ED Notes (Signed)
Pt c/o dysuria, urinary incontinence and a boil on his lower abd x one week.

## 2011-08-28 ENCOUNTER — Encounter: Payer: Self-pay | Admitting: Internal Medicine

## 2011-11-10 ENCOUNTER — Encounter: Payer: Self-pay | Admitting: Internal Medicine

## 2011-11-10 ENCOUNTER — Ambulatory Visit (INDEPENDENT_AMBULATORY_CARE_PROVIDER_SITE_OTHER): Payer: Medicare Other | Admitting: Internal Medicine

## 2011-11-10 DIAGNOSIS — Z950 Presence of cardiac pacemaker: Secondary | ICD-10-CM

## 2011-11-10 DIAGNOSIS — Z01810 Encounter for preprocedural cardiovascular examination: Secondary | ICD-10-CM | POA: Insufficient documentation

## 2011-11-10 DIAGNOSIS — I442 Atrioventricular block, complete: Secondary | ICD-10-CM

## 2011-11-10 LAB — PACEMAKER DEVICE OBSERVATION
AL AMPLITUDE: 2.8 mv
ATRIAL PACING PM: 61
BAMS-0001: 175 {beats}/min
VENTRICULAR PACING PM: 99

## 2011-11-10 NOTE — Progress Notes (Signed)
HPI [name] returns today for followup. He is a pleasant elderly man with hypertension, complete heart block status post pacemaker insertion, a hernia, and rare atrial arrhythmias which are asymptomatic. He is scheduled to undergo hernia repair. The patient denies chest pain or shortness of breath. He is somewhat sedentary. In addition he has a bit of dementia. No Known Allergies   Current Outpatient Prescriptions  Medication Sig Dispense Refill  . citalopram (CELEXA) 20 MG tablet Take 20 mg by mouth daily.        Douglas Joseph diltiazem (CARDIZEM CD) 180 MG 24 hr capsule Take 180 mg by mouth daily.        Douglas Joseph dutasteride (AVODART) 0.5 MG capsule Take 0.5 mg by mouth daily.        . furosemide (LASIX) 20 MG tablet Take 20 mg by mouth daily. For fluid       . gabapentin (NEURONTIN) 300 MG capsule Take 300 mg by mouth daily.        . metoprolol tartrate (LOPRESSOR) 25 MG tablet Take 12.5 mg by mouth 2 (two) times daily.        Douglas Joseph omeprazole (PRILOSEC) 20 MG capsule Take 20 mg by mouth daily. For acid reflux       . senna-docusate (SENOKOT-S) 8.6-50 MG per tablet Take 1 tablet by mouth at bedtime.       . Tamsulosin HCl (FLOMAX) 0.4 MG CAPS Take 0.8 mg by mouth daily.        . traMADol-acetaminophen (ULTRACET) 37.5-325 MG per tablet Take 1 tablet by mouth 2 (two) times daily.           Past Medical History  Diagnosis Date  . Prostate enlargement   . Hypertension   . Coronary artery disease   . Cardiac pacemaker in situ   . Other and unspecified hyperlipidemia   . Chronic airway obstruction, not elsewhere classified   . Urinary frequency   . Backache, unspecified   . Esophageal reflux   . Chronic kidney disease, stage III (moderate)   . Unspecified constipation   . Hypertrophy of prostate without urinary obstruction and other lower urinary tract symptoms (LUTS)   . Osteoarthrosis, unspecified whether generalized or localized, unspecified site   . Unspecified asthma   . Anxiety state, unspecified    . Allergic rhinitis, cause unspecified     ROS:   All systems reviewed and negative except as noted in the HPI.   Past Surgical History  Procedure Date  . Pacemaker insertion   . Hernia repair     Disc, cervical; Dr. Jennye Moccasin     Family History  Problem Relation Age of Onset  . Lung cancer Daughter      History   Social History  . Marital Status: Married    Spouse Name: N/A    Number of Children: N/A  . Years of Education: N/A   Occupational History  . Famer    Social History Main Topics  . Smoking status: Never Smoker   . Smokeless tobacco: Not on file  . Alcohol Use: No  . Drug Use: No  . Sexually Active: Not on file   Other Topics Concern  . Not on file   Social History Narrative   MarriedLives with wife and grandson10th grade education     BP 124/70  Pulse 60  Wt 81.557 kg (179 lb 12.8 oz)  Physical Exam:  Well appearing elderly man, NAD HEENT: Unremarkable Neck:  No JVD, no thyromegally Lymphatics:  No  adenopathy Back:  No CVA tenderness Lungs:  Clear with no wheezes, rales, or rhonchi. HEART:  Regular rate rhythm, no murmurs, no rubs, no clicks Abd:  soft, positive bowel sounds, no organomegally, no rebound, no guarding Ext:  2 plus pulses, no edema, no cyanosis, no clubbing Skin:  No rashes no nodules Neuro:  CN II through XII intact, motor grossly intact  DEVICE  Normal device function.  See PaceArt for details.   Assess/Plan:

## 2011-11-10 NOTE — Assessment & Plan Note (Signed)
His device is working normally. He is approaching but not yet at elective replacement.

## 2011-11-10 NOTE — Assessment & Plan Note (Signed)
Review of the patient's history and physical exam suggest that he is low risk for major cardiovascular complications from pending hernia surgery. No additional diagnostic studies are recommended at this time.

## 2011-11-10 NOTE — Patient Instructions (Addendum)
Your physician recommends that you schedule a follow-up appointment in: 1 month in our [place] office with Gunnar Fusi to check battery life   Okay to proceed with Surgery per Dr Ladona Ridgel

## 2011-11-19 NOTE — Consult Note (Signed)
NAME:  Douglas Joseph, Douglas Joseph NO.:  [id number]  MEDICAL RECORD NO.:  [id number]  LOCATION:  [facility]IO                         FACILITY:  [facility]  PHYSICIAN:  Barbaraann Barthel, M.D. DATE OF BIRTH:  11-28-1924  DATE OF CONSULTATION:  11/19/2011 DATE OF DISCHARGE:                                CONSULTATION   DIAGNOSIS:  Left inguinal hernia.  NOTE:  This is an elderly 78 year old black gentleman who was referred from Dr. Letitia Neri office for repair of left inguinal hernia.  He has had this over the last 10 years and it has increased in size and has become more symptomatic to him.  Because of his significant history of coronary artery disease,  hypertension, and arrhythmias and the fact that as the pacemaker in place,  I sent him to [facility] and he was seen by Dr. Ladona Ridgel, who cleared him for surgery preoperatively.  We planned to do surgery as an outpatient.  Past surgeries have included pacemaker placement in 2005 and he has had a TURP done in the past.  He has no known drug allergies.  For his medications list, please check his medication list.  He is a nonsmoker and a nondrinker.  PHYSICAL EXAMINATION:  VITAL SIGNS:  He is 5 feet 6 inches, weighs 186 pounds, temperature is 97.3, pulse is 60, respirations 12, blood pressure 140/60. HEAD:  Normocephalic. EYES:  Extraocular movements are intact.  Pupils were round and reactive to light and accommodation.  There is noted some conjunctive pallor bilaterally.  The extraocular movements are intact.  Pupils are equal, and reactive to light and accommodation. NECK:  There is no adenopathy.  No bruits are auscultated and there is no thyromegaly. LUNGS:  Clear. HEART:  He is in this pacing rhythm.  His pacemaker is palpable subcutaneously and there is no problem with that incision. ABDOMEN:  Soft.  He has a reducible left inguinal hernia. RECTAL:  His prostate is enlarged, but smooth.  His stool is  guaiac- negative. EXTREMITIES:  He had some pedal edema noted and for that reason, I sent him to Cardiology.  He has +1, +2 pedal edema.  REVIEW OF SYSTEMS:  NEURO SYSTEM:  No history of migraines or seizures. ENDOCRINE SYSTEM:  No history of diabetes or thyroid disease. CARDIOPULMONARY SYSTEM:  The patient is nonsmoker, he has pacemaker history of hypertension and coronary artery disease and dysrhythmias and he has a pacemaker placed.  MUSCULOSKELETAL SYSTEM:  The patient has multiple arthritic complaints and he walks with a cane.  GI SYSTEM:  No past history of hepatitis, no past history of constipation, bright red rectal bleeding, black tarry stools or melena.  No history of inflammatory bowel disease or irritable bowel syndrome and no history of unexplained weight loss.  He has not had a colonoscopy nor does he want one.  GU SYSTEM:  He has a history of benign prostatic hypertrophy.  No history of frequency at present.  No history of kidney stones and no dysuria.  We discussed need for repair of his left inguinal hernia, and we discussed complications not limited to, but including bleeding, infection, and recurrence.  Informed consent was obtained.  We did get  him to sign the consent, which required his putting an X on the consent sheet and his wife signed her name as this patient does not write, he is illiterate.     Barbaraann Barthel, M.D.     WB/MEDQ  D:  11/19/2011  T:  11/19/2011  Job:  [id number]  cc:   Tesfaye D. Felecia Shelling, MD Fax: [phone]  Dr. Ladona Ridgel, [facility]

## 2011-11-20 NOTE — Patient Instructions (Addendum)
20 Douglas Joseph  11/20/2011   Your procedure is scheduled on:   11/24/2011  Report to [facility] at  920  AM.  Call this number if you have problems the morning of surgery: [phone]   Remember:   Do not eat food:After Midnight.  May have clear liquids:until Midnight .  Clear liquids include soda, tea, black coffee, apple or grape juice, broth.  Take these medicines the morning of surgery with A SIP OF WATER: celexa,cardiazem,neurontin,lopressor,prilosec,flomax,ultracet   Do not wear jewelry, make-up or nail polish.  Do not wear lotions, powders, or perfumes. You may wear deodorant.  Do not shave 48 hours prior to surgery.  Do not bring valuables to the hospital.  Contacts, dentures or bridgework may not be worn into surgery.  Leave suitcase in the car. After surgery it may be brought to your room.  For patients admitted to the hospital, checkout time is 11:00 AM the day of discharge.   Patients discharged the day of surgery will not be allowed to drive home.  Name and phone number of your driver: family  Special Instructions: CHG Shower Use Special Wash: 1/2 bottle night before surgery and 1/2 bottle morning of surgery.   Please read over the following fact sheets that you were given: Pain Booklet, MRSA Information, Surgical Site Infection Prevention, Anesthesia Post-op Instructions and Care and Recovery After Surgery Inguinal Hernia, Adult Muscles help keep everything in the body in its proper place. But if a weak spot in the muscles develops, something can poke through. That is called a hernia. When this happens in the lower part of the belly (abdomen), it is called an inguinal hernia. (It takes its name from a part of the body in this region called the inguinal canal.) A weak spot in the wall of muscles lets some fat or part of the small intestine bulge through. An inguinal hernia can develop at any age. Men get them more often than women. CAUSES  In adults, an inguinal hernia develops  over time.  It can be triggered by:   Suddenly straining the muscles of the lower abdomen.   Lifting heavy objects.   Straining to have a bowel movement. Difficult bowel movements (constipation) can lead to this.   Constant coughing. This may be caused by smoking or lung disease.   Being overweight.   Being pregnant.   Working at a job that requires long periods of standing or heavy lifting.   Having had an inguinal hernia before.  One type can be an emergency situation. It is called a strangulated inguinal hernia. It develops if part of the small intestine slips through the weak spot and cannot get back into the abdomen. The blood supply can be cut off. If that happens, part of the intestine may die. This situation requires emergency surgery. SYMPTOMS  Often, a small inguinal hernia has no symptoms. It is found when a healthcare provider does a physical exam. Larger hernias usually have symptoms.   In adults, symptoms may include:   A lump in the groin. This is easier to see when the person is standing. It might disappear when lying down.   In men, a lump in the scrotum.   Pain or burning in the groin. This occurs especially when lifting, straining or coughing.   A dull ache or feeling of pressure in the groin.   Signs of a strangulated hernia can include:   A bulge in the groin that becomes very painful and tender to  the touch.   A bulge that turns red or purple.   Fever, nausea and vomiting.   Inability to have a bowel movement or to pass gas.  DIAGNOSIS  To decide if you have an inguinal hernia, a healthcare provider will probably do a physical examination.  This will include asking questions about any symptoms you have noticed.   The healthcare provider might feel the groin area and ask you to cough. If an inguinal hernia is felt, the healthcare provider may try to slide it back into the abdomen.   Usually no other tests are needed.  TREATMENT  Treatments can  vary. The size of the hernia makes a difference. Options include:  Watchful waiting. This is often suggested if the hernia is small and you have had no symptoms.   No medical procedure will be done unless symptoms develop.   You will need to watch closely for symptoms. If any occur, contact your healthcare provider right away.   Surgery. This is used if the hernia is larger or you have symptoms.   Open surgery. This is usually an outpatient procedure (you will not stay overnight in a hospital). An cut (incision) is made through the skin in the groin. The hernia is put back inside the abdomen. The weak area in the muscles is then repaired by herniorrhaphy or hernioplasty. Herniorrhaphy: in this type of surgery, the weak muscles are sewn back together. Hernioplasty: a patch or mesh is used to close the weak area in the abdominal wall.   Laparoscopy. In this procedure, a surgeon makes small incisions. A thin tube with a tiny video camera (called a laparoscope) is put into the abdomen. The surgeon repairs the hernia with mesh by looking with the video camera and using two long instruments.  HOME CARE INSTRUCTIONS   After surgery to repair an inguinal hernia:   You will need to take pain medicine prescribed by your healthcare provider. Follow all directions carefully.   You will need to take care of the wound from the incision.   Your activity will be restricted for awhile. This will probably include no heavy lifting for several weeks. You also should not do anything too active for a few weeks. When you can return to work will depend on the type of job that you have.   During "watchful waiting" periods, you should:   Maintain a healthy weight.   Eat a diet high in fiber (fruits, vegetables and whole grains).   Drink plenty of fluids to avoid constipation. This means drinking enough water and other liquids to keep your urine clear or pale yellow.   Do not lift heavy objects.   Do not stand  for long periods of time.   Quit smoking. This should keep you from developing a frequent cough.  SEEK MEDICAL CARE IF:   A bulge develops in your groin area.   You feel pain, a burning sensation or pressure in the groin. This might be worse if you are lifting or straining.   You develop a fever of more than 100.5 F (38.1 C).  SEEK IMMEDIATE MEDICAL CARE IF:   Pain in the groin increases suddenly.   A bulge in the groin gets bigger suddenly and does not go down.   For men, there is sudden pain in the scrotum. Or, the size of the scrotum increases.   A bulge in the groin area becomes red or purple and is painful to touch.   You have nausea  or vomiting that does not go away.   You feel your heart beating much faster than normal.   You cannot have a bowel movement or pass gas.   You develop a fever of more than 102.0 F (38.9 C).  Document Released: 01/18/2009 Document Revised: 08/21/2011 Document Reviewed: 01/18/2009 [facility] Patient Information 2012 [place], [place].PATIENT INSTRUCTIONS POST-ANESTHESIA  IMMEDIATELY FOLLOWING SURGERY:  Do not drive or operate machinery for the first twenty four hours after surgery.  Do not make any important decisions for twenty four hours after surgery or while taking narcotic pain medications or sedatives.  If you develop intractable nausea and vomiting or a severe headache please notify your doctor immediately.  FOLLOW-UP:  Please make an appointment with your surgeon as instructed. You do not need to follow up with anesthesia unless specifically instructed to do so.  WOUND CARE INSTRUCTIONS (if applicable):  Keep a dry clean dressing on the anesthesia/puncture wound site if there is drainage.  Once the wound has quit draining you may leave it open to air.  Generally you should leave the bandage intact for twenty four hours unless there is drainage.  If the epidural site drains for more than 36-48 hours please call the anesthesia  department.  QUESTIONS?:  Please feel free to call your physician or the hospital operator if you have any questions, and they will be happy to assist you.     [facility] Anesthesia Department [address] [place] [phone]

## 2011-11-21 ENCOUNTER — Encounter (HOSPITAL_COMMUNITY)
Admission: RE | Admit: 2011-11-21 | Discharge: 2011-11-21 | Disposition: A | Discharge status: Home / Self Care | Payer: Medicare Other | Source: Ambulatory Visit | Attending: General Surgery | Admitting: General Surgery

## 2011-11-21 ENCOUNTER — Other Ambulatory Visit: Payer: Self-pay

## 2011-11-21 ENCOUNTER — Encounter (HOSPITAL_COMMUNITY): Payer: Self-pay

## 2011-11-21 ENCOUNTER — Encounter (HOSPITAL_COMMUNITY): Payer: Self-pay | Admitting: Pharmacy Technician

## 2011-11-21 LAB — BASIC METABOLIC PANEL
Chloride: 100 mEq/L (ref 96–112)
GFR calc Af Amer: 50 mL/min — ABNORMAL LOW (ref >90)
GFR calc non Af Amer: 43 mL/min — ABNORMAL LOW (ref >90)
Glucose, Bld: 79 mg/dL (ref 70–99)
Potassium: 4.3 mEq/L (ref 3.5–5.1)
Sodium: 136 mEq/L (ref 135–145)

## 2011-11-21 LAB — DIFFERENTIAL
Lymphs Abs: 1.3 10*3/uL (ref 0.7–4.0)
Monocytes Relative: 9 % (ref 3–12)
Neutro Abs: 1.7 10*3/uL (ref 1.7–7.7)
Neutrophils Relative %: 49 % (ref 43–77)

## 2011-11-21 LAB — CBC
HCT: 40.8 % (ref 39.0–52.0)
Hemoglobin: 13.6 g/dL (ref 13.0–17.0)
MCH: 29.4 pg (ref 26.0–34.0)
RBC: 4.63 MIL/uL (ref 4.22–5.81)

## 2011-11-24 ENCOUNTER — Encounter (HOSPITAL_COMMUNITY): Payer: Self-pay | Admitting: *Deleted

## 2011-11-24 ENCOUNTER — Encounter (HOSPITAL_COMMUNITY)
Admission: RE | Disposition: A | Discharge status: Home / Self Care | Payer: Self-pay | Source: Ambulatory Visit | Attending: General Surgery

## 2011-11-24 ENCOUNTER — Ambulatory Visit (HOSPITAL_COMMUNITY): Payer: Medicare Other | Admitting: Anesthesiology

## 2011-11-24 ENCOUNTER — Encounter (HOSPITAL_COMMUNITY): Payer: Self-pay | Admitting: Anesthesiology

## 2011-11-24 ENCOUNTER — Observation Stay (HOSPITAL_COMMUNITY)
Admission: RE | Admit: 2011-11-24 | Discharge: 2011-11-25 | Disposition: A | Discharge status: Home / Self Care | Payer: Medicare Other | Source: Ambulatory Visit | Attending: General Surgery | Admitting: General Surgery

## 2011-11-24 DIAGNOSIS — Z950 Presence of cardiac pacemaker: Secondary | ICD-10-CM

## 2011-11-24 DIAGNOSIS — R350 Frequency of micturition: Secondary | ICD-10-CM

## 2011-11-24 DIAGNOSIS — Z01812 Encounter for preprocedural laboratory examination: Secondary | ICD-10-CM | POA: Insufficient documentation

## 2011-11-24 DIAGNOSIS — J309 Allergic rhinitis, unspecified: Secondary | ICD-10-CM

## 2011-11-24 DIAGNOSIS — J449 Chronic obstructive pulmonary disease, unspecified: Secondary | ICD-10-CM

## 2011-11-24 DIAGNOSIS — J45909 Unspecified asthma, uncomplicated: Secondary | ICD-10-CM

## 2011-11-24 DIAGNOSIS — E785 Hyperlipidemia, unspecified: Secondary | ICD-10-CM

## 2011-11-24 DIAGNOSIS — M1990 Unspecified osteoarthritis, unspecified site: Secondary | ICD-10-CM

## 2011-11-24 DIAGNOSIS — F411 Generalized anxiety disorder: Secondary | ICD-10-CM

## 2011-11-24 DIAGNOSIS — N400 Enlarged prostate without lower urinary tract symptoms: Secondary | ICD-10-CM

## 2011-11-24 DIAGNOSIS — Z01810 Encounter for preprocedural cardiovascular examination: Secondary | ICD-10-CM

## 2011-11-24 DIAGNOSIS — K5900 Constipation, unspecified: Secondary | ICD-10-CM

## 2011-11-24 DIAGNOSIS — K219 Gastro-esophageal reflux disease without esophagitis: Secondary | ICD-10-CM

## 2011-11-24 DIAGNOSIS — N183 Chronic kidney disease, stage 3 unspecified: Secondary | ICD-10-CM

## 2011-11-24 DIAGNOSIS — I2510 Atherosclerotic heart disease of native coronary artery without angina pectoris: Secondary | ICD-10-CM | POA: Insufficient documentation

## 2011-11-24 DIAGNOSIS — J4489 Other specified chronic obstructive pulmonary disease: Secondary | ICD-10-CM

## 2011-11-24 DIAGNOSIS — I10 Essential (primary) hypertension: Secondary | ICD-10-CM

## 2011-11-24 DIAGNOSIS — M549 Dorsalgia, unspecified: Secondary | ICD-10-CM

## 2011-11-24 DIAGNOSIS — K4090 Unilateral inguinal hernia, without obstruction or gangrene, not specified as recurrent: Principal | ICD-10-CM | POA: Insufficient documentation

## 2011-11-24 HISTORY — PX: INGUINAL HERNIA REPAIR: SHX194

## 2011-11-24 SURGERY — REPAIR, HERNIA, INGUINAL, ADULT
Anesthesia: Spinal | Site: Groin | Laterality: Left | Wound class: Clean

## 2011-11-24 MED ORDER — ACETAMINOPHEN 650 MG RE SUPP: 650.0000 mg | Freq: Four times a day (QID) | RECTAL | Status: DC | PRN

## 2011-11-24 MED ORDER — BUPIVACAINE HCL (PF) 0.5 % IJ SOLN
INTRAMUSCULAR | Status: AC
  Filled 2011-11-24: qty 30

## 2011-11-24 MED ORDER — MIDAZOLAM HCL 2 MG/2ML IJ SOLN
INTRAMUSCULAR | Status: AC
  Administered 2011-11-24: 2 mg via INTRAVENOUS
  Filled 2011-11-24: qty 2

## 2011-11-24 MED ORDER — PROPOFOL 10 MG/ML IV EMUL
INTRAVENOUS | Status: DC | PRN
  Administered 2011-11-24: 50 ug/kg/min via INTRAVENOUS

## 2011-11-24 MED ORDER — LACTATED RINGERS IV SOLN
INTRAVENOUS | Status: DC | PRN
  Administered 2011-11-24: 10:00:00 via INTRAVENOUS

## 2011-11-24 MED ORDER — CITALOPRAM HYDROBROMIDE 20 MG PO TABS
20.0000 mg | ORAL_TABLET | Freq: Every morning | ORAL | Status: DC
  Administered 2011-11-25: 20 mg via ORAL
  Filled 2011-11-24: qty 1

## 2011-11-24 MED ORDER — MORPHINE SULFATE 2 MG/ML IJ SOLN
0.5000 mg | INTRAMUSCULAR | Status: DC | PRN
  Administered 2011-11-25: 0.5 mg via INTRAVENOUS
  Filled 2011-11-24: qty 1

## 2011-11-24 MED ORDER — POTASSIUM CHLORIDE IN NACL 20-0.9 MEQ/L-% IV SOLN
INTRAVENOUS | Status: DC
  Administered 2011-11-24: 17:00:00 via INTRAVENOUS

## 2011-11-24 MED ORDER — LACTATED RINGERS IV SOLN
INTRAVENOUS | Status: DC
  Administered 2011-11-24: 1000 mL via INTRAVENOUS

## 2011-11-24 MED ORDER — PROPOFOL 10 MG/ML IV EMUL
INTRAVENOUS | Status: AC
  Filled 2011-11-24: qty 20

## 2011-11-24 MED ORDER — TRAMADOL-ACETAMINOPHEN 37.5-325 MG PO TABS
1.0000 | ORAL_TABLET | Freq: Two times a day (BID) | ORAL | Status: DC
  Administered 2011-11-24 – 2011-11-25 (×2): 1 via ORAL
  Filled 2011-11-24 (×2): qty 1

## 2011-11-24 MED ORDER — ONDANSETRON HCL 4 MG/2ML IJ SOLN: 4.0000 mg | Freq: Once | INTRAMUSCULAR | Status: DC | PRN

## 2011-11-24 MED ORDER — CEFAZOLIN SODIUM 1-5 GM-% IV SOLN
INTRAVENOUS | Status: DC | PRN
  Administered 2011-11-24: 1 g via INTRAVENOUS

## 2011-11-24 MED ORDER — SODIUM CHLORIDE 0.9 % IR SOLN
Status: DC | PRN
  Administered 2011-11-24: 1000 mL

## 2011-11-24 MED ORDER — LIDOCAINE HCL (PF) 1 % IJ SOLN
INTRAMUSCULAR | Status: AC
  Filled 2011-11-24: qty 5

## 2011-11-24 MED ORDER — METOPROLOL TARTRATE 25 MG PO TABS
25.0000 mg | ORAL_TABLET | Freq: Every day | ORAL | Status: DC
  Administered 2011-11-24: 25 mg via ORAL
  Filled 2011-11-24: qty 1

## 2011-11-24 MED ORDER — ONDANSETRON HCL 4 MG/2ML IJ SOLN: 4.0000 mg | Freq: Four times a day (QID) | INTRAMUSCULAR | Status: DC | PRN

## 2011-11-24 MED ORDER — GABAPENTIN 300 MG PO CAPS
300.0000 mg | ORAL_CAPSULE | Freq: Every morning | ORAL | Status: DC
  Administered 2011-11-25: 300 mg via ORAL
  Filled 2011-11-24: qty 1

## 2011-11-24 MED ORDER — FENTANYL CITRATE 0.05 MG/ML IJ SOLN
INTRAMUSCULAR | Status: DC | PRN
  Administered 2011-11-24: 20 ug via INTRATHECAL

## 2011-11-24 MED ORDER — DUTASTERIDE 0.5 MG PO CAPS
0.5000 mg | ORAL_CAPSULE | Freq: Every morning | ORAL | Status: DC
  Administered 2011-11-25: 0.5 mg via ORAL
  Filled 2011-11-24 (×2): qty 1

## 2011-11-24 MED ORDER — TIOTROPIUM BROMIDE MONOHYDRATE 18 MCG IN CAPS
18.0000 ug | ORAL_CAPSULE | Freq: Every morning | RESPIRATORY_TRACT | Status: DC
  Administered 2011-11-25: 18 ug via RESPIRATORY_TRACT
  Filled 2011-11-24: qty 5

## 2011-11-24 MED ORDER — FUROSEMIDE 20 MG PO TABS
20.0000 mg | ORAL_TABLET | Freq: Every morning | ORAL | Status: DC
  Administered 2011-11-25: 20 mg via ORAL
  Filled 2011-11-24: qty 1

## 2011-11-24 MED ORDER — LIDOCAINE HCL (CARDIAC) 10 MG/ML IV SOLN
INTRAVENOUS | Status: DC | PRN
  Administered 2011-11-24: 50 mg via INTRAVENOUS

## 2011-11-24 MED ORDER — BUPIVACAINE HCL (PF) 0.5 % IJ SOLN
INTRAMUSCULAR | Status: DC | PRN
  Administered 2011-11-24: 10 mL

## 2011-11-24 MED ORDER — PANTOPRAZOLE SODIUM 40 MG PO TBEC
40.0000 mg | DELAYED_RELEASE_TABLET | Freq: Every day | ORAL | Status: DC
  Administered 2011-11-24: 40 mg via ORAL
  Filled 2011-11-24: qty 1

## 2011-11-24 MED ORDER — TAMSULOSIN HCL 0.4 MG PO CAPS
0.8000 mg | ORAL_CAPSULE | Freq: Every morning | ORAL | Status: DC
  Administered 2011-11-25: 0.8 mg via ORAL
  Filled 2011-11-24 (×2): qty 1

## 2011-11-24 MED ORDER — DILTIAZEM HCL ER COATED BEADS 180 MG PO CP24
180.0000 mg | ORAL_CAPSULE | Freq: Every morning | ORAL | Status: DC
  Administered 2011-11-25: 180 mg via ORAL
  Filled 2011-11-24: qty 1

## 2011-11-24 MED ORDER — FENTANYL CITRATE 0.05 MG/ML IJ SOLN
INTRAMUSCULAR | Status: AC
  Filled 2011-11-24: qty 2

## 2011-11-24 MED ORDER — ACETAMINOPHEN 325 MG PO TABS: 650.0000 mg | ORAL_TABLET | Freq: Four times a day (QID) | ORAL | Status: DC | PRN

## 2011-11-24 MED ORDER — STERILE WATER FOR IRRIGATION IR SOLN
Status: DC | PRN
  Administered 2011-11-24: 2000 mL

## 2011-11-24 MED ORDER — MIDAZOLAM HCL 2 MG/2ML IJ SOLN
1.0000 mg | INTRAMUSCULAR | Status: DC | PRN
  Administered 2011-11-24: 2 mg via INTRAVENOUS

## 2011-11-24 MED ORDER — CEFAZOLIN SODIUM 1-5 GM-% IV SOLN
INTRAVENOUS | Status: AC
  Filled 2011-11-24: qty 50

## 2011-11-24 MED ORDER — CEFAZOLIN SODIUM 1-5 GM-% IV SOLN: 1.0000 g | INTRAVENOUS | Status: DC

## 2011-11-24 MED ORDER — SENNOSIDES-DOCUSATE SODIUM 8.6-50 MG PO TABS
1.0000 | ORAL_TABLET | Freq: Every day | ORAL | Status: DC
  Administered 2011-11-24: 1 via ORAL
  Filled 2011-11-24: qty 1

## 2011-11-24 MED ORDER — BUPIVACAINE HCL 0.75 % IJ SOLN
INTRAMUSCULAR | Status: DC | PRN
  Administered 2011-11-24: 2 mL via INTRATHECAL

## 2011-11-24 MED ORDER — FENTANYL CITRATE 0.05 MG/ML IJ SOLN: 25.0000 ug | INTRAMUSCULAR | Status: DC | PRN

## 2011-11-24 SURGICAL SUPPLY — 46 items
ATTRACTOMAT 16X20 MAGNETIC DRP (DRAPES) ×2 IMPLANT
BAG HAMPER (MISCELLANEOUS) ×2 IMPLANT
CLEANER TIP ELECTROSURG 2X2 (MISCELLANEOUS) ×1 IMPLANT
CLOTH BEACON ORANGE TIMEOUT ST (SAFETY) ×2 IMPLANT
COVER LIGHT HANDLE STERIS (MISCELLANEOUS) ×4 IMPLANT
DECANTER SPIKE VIAL GLASS SM (MISCELLANEOUS) ×2 IMPLANT
DRAIN PENROSE 12X.25 LTX STRL (MISCELLANEOUS) ×2 IMPLANT
DRSG MEPILEX BORDER 4X8 (GAUZE/BANDAGES/DRESSINGS) IMPLANT
DRSG TEGADERM 4X4.75 (GAUZE/BANDAGES/DRESSINGS) ×1 IMPLANT
ELECT REM PT RETURN 9FT ADLT (ELECTROSURGICAL) ×2
ELECTRODE REM PT RTRN 9FT ADLT (ELECTROSURGICAL) ×1 IMPLANT
FORMALIN 10 PREFIL 120ML (MISCELLANEOUS) ×2 IMPLANT
GLOVE SKINSENSE NS SZ7.0 (GLOVE) ×1
GLOVE SKINSENSE STRL SZ7.0 (GLOVE) ×1 IMPLANT
GLOVE SS BIOGEL STRL SZ 6.5 (GLOVE) IMPLANT
GLOVE SUPERSENSE BIOGEL SZ 6.5 (GLOVE) ×3
GOWN STRL REIN XL XLG (GOWN DISPOSABLE) ×6 IMPLANT
INST SET MINOR GENERAL (KITS) ×2 IMPLANT
KIT ROOM TURNOVER APOR (KITS) ×2 IMPLANT
MANIFOLD NEPTUNE II (INSTRUMENTS) ×2 IMPLANT
NDL HYPO 25X1 1.5 SAFETY (NEEDLE) ×1 IMPLANT
NEEDLE HYPO 25X1 1.5 SAFETY (NEEDLE) ×2 IMPLANT
NS IRRIG 1000ML POUR BTL (IV SOLUTION) ×2 IMPLANT
PACK MINOR (CUSTOM PROCEDURE TRAY) ×2 IMPLANT
PAD ARMBOARD 7.5X6 YLW CONV (MISCELLANEOUS) ×2 IMPLANT
SET BASIN LINEN APH (SET/KITS/TRAYS/PACK) ×2 IMPLANT
SOL PREP PROV IODINE SCRUB 4OZ (MISCELLANEOUS) ×2 IMPLANT
SPONGE GAUZE 4X4 12PLY (GAUZE/BANDAGES/DRESSINGS) ×2 IMPLANT
SPONGE INTESTINAL PEANUT (DISPOSABLE) ×2 IMPLANT
SPONGE LAP 18X18 X RAY DECT (DISPOSABLE) ×2 IMPLANT
STAPLER VISISTAT 35W (STAPLE) ×2 IMPLANT
SUT NOVA NAB GS-22 2 2-0 T-19 (SUTURE) IMPLANT
SUT NUROLON NAB CT 2 2-0 18IN (SUTURE) ×1 IMPLANT
SUT PROLENE 0 CT 1 CR/8 (SUTURE) IMPLANT
SUT SILK 2 0 (SUTURE) ×2
SUT SILK 2-0 18XBRD TIE 12 (SUTURE) ×1 IMPLANT
SUT VIC AB 0 CT1 27 (SUTURE) ×2
SUT VIC AB 0 CT1 27XBRD ANTBC (SUTURE) ×1 IMPLANT
SUT VIC AB 3-0 SH 27 (SUTURE)
SUT VIC AB 3-0 SH 27X BRD (SUTURE) ×1 IMPLANT
SUT VICRYL AB 3 0 TIES (SUTURE) ×2 IMPLANT
SYR BULB IRRIGATION 50ML (SYRINGE) ×2 IMPLANT
SYR CONTROL 10ML LL (SYRINGE) ×2 IMPLANT
TAPE CLOTH SURG 4X10 WHT LF (GAUZE/BANDAGES/DRESSINGS) ×1 IMPLANT
TRAY FOLEY CATH 14FR (SET/KITS/TRAYS/PACK) ×2 IMPLANT
WATER STERILE IRR 1000ML POUR (IV SOLUTION) ×4 IMPLANT

## 2011-11-24 NOTE — Progress Notes (Signed)
Continue waiting on rm assignment.

## 2011-11-24 NOTE — Progress Notes (Signed)
No machine available at this time for bp.

## 2011-11-24 NOTE — Anesthesia Preprocedure Evaluation (Signed)
Anesthesia Evaluation  Patient identified by MRN, date of birth, ID band Patient awake    Reviewed: Allergy & Precautions, H&P , NPO status , Patient's Chart, lab work & pertinent test results  History of Anesthesia Complications Negative for: history of anesthetic complications  Airway Mallampati: II      Dental  (+) Teeth Intact and Missing   Pulmonary  breath sounds clear to auscultation        Cardiovascular hypertension, Pt. on medications + CAD + pacemaker Rhythm:Regular     Neuro/Psych Anxiety    GI/Hepatic GERD-  Medicated and Controlled,  Endo/Other    Renal/GU      Musculoskeletal   Abdominal   Peds  Hematology   Anesthesia Other Findings   Reproductive/Obstetrics                           Anesthesia Physical Anesthesia Plan  ASA: III  Anesthesia Plan: Spinal   Post-op Pain Management:    Induction:   Airway Management Planned: Nasal Cannula  Additional Equipment:   Intra-op Plan:   Post-operative Plan:   Informed Consent: I have reviewed the patients History and Physical, chart, labs and discussed the procedure including the risks, benefits and alternatives for the proposed anesthesia with the patient or authorized representative who has indicated his/her understanding and acceptance.     Plan Discussed with:   Anesthesia Plan Comments:         Anesthesia Quick Evaluation

## 2011-11-24 NOTE — Preoperative (Signed)
Beta Blockers   Reason not to administer Beta Blockers:Not Applicable 

## 2011-11-24 NOTE — Progress Notes (Signed)
78 yr old B male  For elective repair of LIH.  Pt seen and cleared for surgery by cardiology service.   Site marked and labs reviewed and consent obtained.  No change clinically since H&P.  Dict. # [id number].  Filed Vitals:   11/24/11 1100  BP: 157/88  Pulse:   Temp:   Resp: 17   Pulse 66/min,  temp. 97.6

## 2011-11-24 NOTE — Anesthesia Postprocedure Evaluation (Signed)
  Anesthesia Post-op Note  Patient: Douglas Joseph  Procedure(s) Performed: Procedure(s) (LRB): HERNIA REPAIR INGUINAL ADULT (Left)  Patient Location: PACU  Anesthesia Type: Spinal  Level of Consciousness: awake, alert , oriented and patient cooperative  Airway and Oxygen Therapy: Patient Spontanous Breathing  Post-op Pain: none  Post-op Assessment: Post-op Vital signs reviewed, Patient's Cardiovascular Status Stable, Respiratory Function Stable, Patent Airway, No signs of Nausea or vomiting and Pain level controlled  Post-op Vital Signs: Reviewed and stable  Complications: No apparent anesthesia complications

## 2011-11-24 NOTE — Progress Notes (Signed)
Post OP Check  Pt. awake and alert; dressing dry and in tact.  Min. Discomfort.  No head ache from spinal.  Will D/C foley when ambulating.  Filed Vitals:   11/24/11 1511  BP:   Pulse:   Temp: 97.5 F (36.4 C)  Resp: 16   Doing well post op.  Discharge planned for AM.

## 2011-11-24 NOTE — Op Note (Signed)
[name], Douglas Joseph NO.:  [id number]  MEDICAL RECORD NO.:  [id number]  LOCATION:  A332                          FACILITY:  [facility]  PHYSICIAN:  Barbaraann Barthel, M.D. DATE OF BIRTH:  01/14/1925  DATE OF PROCEDURE:  11/24/2011 DATE OF DISCHARGE:                              OPERATIVE REPORT   DIAGNOSIS:  Left inguinal hernia.  PROCEDURE:  Left inguinal herniorrhaphy (modified McVay repair without mesh).  Note, this is an 78 year old black gentleman who was referred by Dr. Felecia Shelling for a left inguinal hernia that has been present for at least 10 years.  As this patient had a significant history of coronary artery disease and had a history of his right heart block and had a pacemaker inserted, I made sure that the [facility] saw him preoperatively. They saw him, and he was cleared for surgery.  We discussed the procedure with him in detail along with his wife discussing complications, not limited to, but including bleeding, infection, and recurrence.  Informed consent was obtained.  GROSS OPERATIVE FINDINGS:  Those consistent with an indirect and direct hernia, there were no other abnormalities encountered.  TECHNIQUE:  The patient was placed in a supine position.  After the adequate administration of spinal anesthesia, he was prepped with Betadine solution and draped in usual manner.  Prior to draping, a Foley catheter was aseptically inserted.  We then made an incision between the anterior and superior iliac spine and the pubic tubercle through skin, subcutaneous tissue, through Scarpa's layer, and we opened the external oblique through the external ring.  The cord structures were then dissected free from the sac which was opened.  We opened the sac and under direct vision, doubly ligated this with 2-0 Bralon and amputated the redundant portion of the sac.  We also excised a rather large lipoma in this area of the internal ring.  After this was completed, I  then sutured transversus abdominis and transversalis fascia to Cooper's ligament and Poupart's ligament with interrupted 2-0 Bralon sutures. Prior to cinching these tightly, a relaxing incision was carried out. Once this was finished, I then irrigated, checked for hemostasis, and then used 0.5% Sensorcaine along the fascial line to help with postoperative comfort.  After irrigating, we then closed the skin with a stapling device.  Prior to closure, all sponge, needle, and instrument counts were found to be correct.  Estimated blood loss was minimal, and 500 mL crystalloids were given intraoperatively.  No drains were placed.  There were no complications.     Barbaraann Barthel, M.D.     WB/MEDQ  D:  11/24/2011  T:  11/24/2011  Job:  [id number]  cc:   Tesfaye D. Felecia Shelling, MD Fax: [phone]

## 2011-11-24 NOTE — Anesthesia Procedure Notes (Signed)
Spinal  Patient location during procedure: OR Start time: 11/24/2011 12:06 PM Preanesthetic Checklist Completed: patient identified, site marked, surgical consent, pre-op evaluation, timeout performed, IV checked, risks and benefits discussed and monitors and equipment checked Spinal Block Patient position: left lateral decubitus Prep: Betadine Patient monitoring: heart rate, cardiac monitor, continuous pulse ox and blood pressure Approach: left paramedian Location: L3-4 Injection technique: single-shot Needle Needle type: Spinocan  Needle gauge: 22 G Needle length: 9 cm Assessment Sensory level: T8 Additional Notes  ATTEMPTS:2  ( [facility] x2  Dr. Marcos Eke x 1) Doran Clay [id number] TRAY EXPIRATION DATE:07/2012  !206 .75% Bupivacaine 2cc fentanyl 20 mcg epi.1 injected intrathecally pt. Tolerated well

## 2011-11-24 NOTE — Transfer of Care (Signed)
Immediate Anesthesia Transfer of Care Note  Patient: Douglas Joseph  Procedure(s) Performed: Procedure(s) (LRB): HERNIA REPAIR INGUINAL ADULT (Left)  Patient Location: PACU  Anesthesia Type: Spinal  Level of Consciousness: awake and patient cooperative  Airway & Oxygen Therapy: Patient Spontanous Breathing and Patient connected to face mask oxygen  Post-op Assessment: Report given to PACU RN and Post -op Vital signs reviewed and stable  Post vital signs: Reviewed and stable  Complications: No apparent anesthesia complications

## 2011-11-25 MED ORDER — TRAMADOL-ACETAMINOPHEN 37.5-325 MG PO TABS: 1.0000 | ORAL_TABLET | Freq: Three times a day (TID) | ORAL | Status: AC | PRN

## 2011-11-25 NOTE — Addendum Note (Signed)
Addendum  created 11/25/11 0733 by Marolyn Hammock, CRNA   Modules edited:Notes Section

## 2011-11-25 NOTE — Progress Notes (Signed)
UR Chart Review Completed  

## 2011-11-25 NOTE — Anesthesia Postprocedure Evaluation (Signed)
  Anesthesia Post-op Note  Patient: Douglas Joseph  Procedure(s) Performed: Procedure(s) (LRB): HERNIA REPAIR INGUINAL ADULT (Left)  Patient Location: Room 332  Anesthesia Type: Spinal  Level of Consciousness: awake, alert , oriented and patient cooperative  Airway and Oxygen Therapy: Patient Spontanous Breathing  Post-op Pain: mild  Post-op Assessment: Post-op Vital signs reviewed, Patient's Cardiovascular Status Stable, Respiratory Function Stable, Patent Airway, No signs of Nausea or vomiting and Pain level controlled  Post-op Vital Signs: Reviewed and stable  Complications: No apparent anesthesia complications

## 2011-11-25 NOTE — Progress Notes (Signed)
POD #1  Wound clean and redressed.  Pt voiding without dysuria.   Min discomfort post op.  No headache after spinal and doing quite well.  Pt will follow up with Cardiology for pace maker change Douglas Joseph).  Discharge and F/U arrangements made.  Filed Vitals:   11/25/11 0803  BP: 170/95  Pulse: 77  Temp: 99.5 F (37.5 C)  Resp: 18    Discharge dict. # [id number]

## 2011-11-26 NOTE — Discharge Summary (Signed)
Douglas Joseph, Douglas Joseph               ACCOUNT NO.:  [id number]  MEDICAL RECORD NO.:  [id number]  LOCATION:  A332                          FACILITY:  [facility]  PHYSICIAN:  Barbaraann Barthel, M.D. DATE OF BIRTH:  1924/10/11  DATE OF ADMISSION:  11/24/2011 DATE OF DISCHARGE:  03/12/2013LH                              DISCHARGE SUMMARY   DIAGNOSIS:  Left inguinal hernia.  PROCEDURE:  Left inguinal herniorrhaphy (modified McVay repair without mesh) done on November 24, 2011.  NOTE:  This is a an 78 year old black male who was referred by Dr. Felecia Shelling, for a left inguinal hernia.  The patient had significant cardiac history and was seen by [facility] preoperatively and cleared for surgery. He underwent an uneventful left inguinal herniorrhaphy repair under spinal anesthesia on November 24, 2011.  He was kept in the hospital for an observation period, less than 24 hours, and his stay was completely uneventful.  He did keep his Foley in overnight due to the spinal anesthesia, and this was removed in the morning and he was able to void without any problems after its removal.  At the time of his discharge, his wound was clean.  He was voiding well and tolerating p.o., and his pain was under control.  He will be discharged and we have made followup arrangements and prior to being discharged, he will have arrangements made to follow up with [facility] for his pacemaker to have this recalibrated or have the batteries changed, recharged.  Then these arrangements will be made by the nursing staff prior to his being discharged here.  We will follow him perioperatively after which he is to return to Dr. Felecia Shelling, for any medical problems he may have in the future.     Barbaraann Barthel, M.D.     WB/MEDQ  D:  11/25/2011  T:  11/26/2011  Job:  [id number]  cc:   Tesfaye D. Felecia Shelling, MD Fax: [phone]   Group

## 2011-11-27 ENCOUNTER — Encounter (HOSPITAL_COMMUNITY): Payer: Self-pay | Admitting: General Surgery

## 2011-11-27 NOTE — Progress Notes (Signed)
Discharge instructions and prescriptions given, verbalized understanding, out via w/c with staff in stable condition. 

## 2012-01-16 ENCOUNTER — Ambulatory Visit (INDEPENDENT_AMBULATORY_CARE_PROVIDER_SITE_OTHER): Payer: Medicare Other | Admitting: *Deleted

## 2012-01-16 DIAGNOSIS — I442 Atrioventricular block, complete: Secondary | ICD-10-CM

## 2012-01-16 LAB — PACEMAKER DEVICE OBSERVATION
BMOD-0003RV: 30
BMOD-0005RV: 95 {beats}/min
VENTRICULAR PACING PM: 91

## 2012-01-16 NOTE — Progress Notes (Signed)
PPM interrogation. 

## 2012-01-26 ENCOUNTER — Encounter: Payer: Self-pay | Admitting: Internal Medicine

## 2012-01-26 ENCOUNTER — Ambulatory Visit (INDEPENDENT_AMBULATORY_CARE_PROVIDER_SITE_OTHER): Payer: Medicare Other | Admitting: Internal Medicine

## 2012-01-26 VITALS — BP 88/70 | HR 64 | Resp 16 | Ht 66.0 in | Wt 162.0 lb

## 2012-01-26 DIAGNOSIS — Z950 Presence of cardiac pacemaker: Secondary | ICD-10-CM

## 2012-01-26 DIAGNOSIS — I10 Essential (primary) hypertension: Secondary | ICD-10-CM

## 2012-01-26 DIAGNOSIS — Z7901 Long term (current) use of anticoagulants: Secondary | ICD-10-CM

## 2012-01-26 DIAGNOSIS — Z01818 Encounter for other preprocedural examination: Secondary | ICD-10-CM

## 2012-01-26 NOTE — Assessment & Plan Note (Signed)
His blood pressure is on the low side. I suspect this is related to the functioning of his pacemaker.

## 2012-01-26 NOTE — Assessment & Plan Note (Signed)
His device has reached elective replacement. We'll schedule pacemaker generator change.

## 2012-01-26 NOTE — Progress Notes (Signed)
HPI [name] returns today for followup. He has a history of complete heart block and is status post permanent pacemaker insertion. He denies chest pain. Over the past month, the patient has noted increasing fatigue and weakness. He also has dizziness. This coincides with his pacemaker reverting from the DDD pacing mode to the VVI pacing mode. He has not had syncope. No Known Allergies   Current Outpatient Prescriptions  Medication Sig Dispense Refill  . aspirin EC 81 MG tablet Take 81 mg by mouth every morning.      . citalopram (CELEXA) 20 MG tablet Take 20 mg by mouth every morning.       . diltiazem (CARDIZEM CD) 180 MG 24 hr capsule Take 180 mg by mouth every morning.       . dutasteride (AVODART) 0.5 MG capsule Take 0.5 mg by mouth every morning.       . furosemide (LASIX) 20 MG tablet Take 20 mg by mouth every morning. For fluid      . metoprolol tartrate (LOPRESSOR) 25 MG tablet Take 25 mg by mouth at bedtime. 1/2 po bid      . omeprazole (PRILOSEC) 20 MG capsule Take 20 mg by mouth every morning. For acid reflux      . senna-docusate (SENOKOT-S) 8.6-50 MG per tablet Take 1 tablet by mouth at bedtime.       . Tamsulosin HCl (FLOMAX) 0.4 MG CAPS Take 0.8 mg by mouth every morning.       . tiotropium (SPIRIVA) 18 MCG inhalation capsule Place 18 mcg into inhaler and inhale every morning.      . traMADol-acetaminophen (ULTRACET) 37.5-325 MG per tablet Take 1 tablet by mouth 2 (two) times daily.           Past Medical History  Diagnosis Date  . Prostate enlargement   . Hypertension   . Coronary artery disease   . Cardiac pacemaker in situ   . Other and unspecified hyperlipidemia   . Chronic airway obstruction, not elsewhere classified   . Urinary frequency   . Backache, unspecified   . Esophageal reflux   . Chronic kidney disease, stage III (moderate)   . Unspecified constipation   . Hypertrophy of prostate without urinary obstruction and other lower urinary tract symptoms  (LUTS)   . Osteoarthrosis, unspecified whether generalized or localized, unspecified site   . Unspecified asthma   . Anxiety state, unspecified   . Allergic rhinitis, cause unspecified     ROS:   All systems reviewed and negative except as noted in the HPI.   Past Surgical History  Procedure Date  . Pacemaker insertion     10 yrs ago  . Cervical fusion     cervical neck  . Inguinal hernia repair 11/24/2011    Procedure: HERNIA REPAIR INGUINAL ADULT;  Surgeon: Marlane Hatcher, MD;  Location: [facility];  Service: General;  Laterality: Left;     Family History  Problem Relation Age of Onset  . Lung cancer Daughter   . Anesthesia problems Neg Hx   . Hypotension Neg Hx   . Malignant hyperthermia Neg Hx   . Pseudochol deficiency Neg Hx      History   Social History  . Marital Status: Married    Spouse Name: N/A    Number of Children: N/A  . Years of Education: N/A   Occupational History  . [employer]    Social History Main Topics  . Smoking status: Former Smoker -- 2.0  packs/day for 20 years    Types: Cigarettes    Quit date: 11/21/1958  . Smokeless tobacco: Not on file  . Alcohol Use: No  . Drug Use: No  . Sexually Active: Yes    Birth Control/ Protection: None   Other Topics Concern  . Not on file   Social History Narrative   MarriedLives with wife and grandson10th grade education     BP 88/70  Pulse 64  Resp 16  Ht 5\' 6"  (1.676 m)  Wt 162 lb (73.483 kg)  BMI 26.15 kg/m2  Physical Exam:  Elderly but Well appearing NAD HEENT: Unremarkable Neck:  7 cm JVD, no thyromegally Lungs:  Clear except for basilar rales. No wheezes or rhonchi. No increased work of breathing. HEART:  Regular rate rhythm, no murmurs, no rubs, no clicks Abd:  soft, positive bowel sounds, no organomegally, no rebound, no guarding Ext:  2 plus pulses, no edema, no cyanosis, no clubbing Skin:  No rashes no nodules Neuro:  CN II through XII intact, motor grossly intact  DEVICE    Normal device function.  See PaceArt for details. Device at elective replacement  Assess/Plan:

## 2012-01-26 NOTE — Patient Instructions (Addendum)
**Note De-Identified [name] Obfuscation** Your physician recommends that you return for lab work in: Friday May 17  Your physician has recommended that you have a pacemaker inserted. A pacemaker is a small device that is placed under the skin of your chest or abdomen to help control abnormal heart rhythms. This device uses electrical pulses to prompt the heart to beat at a normal rate. Pacemakers are used to treat heart rhythms that are too slow. Wire (leads) are attached to the pacemaker that goes into the chambers of you heart. This is done in the hospital and usually requires and overnight stay. Please see the instruction sheet given to you today for more information.  Your physician recommends that you continue on your current medications as directed. Please refer to the Current Medication list given to you today.  Your physician recommends that you schedule a follow-up appointment in: after generator change out

## 2012-01-28 ENCOUNTER — Encounter: Payer: Self-pay | Admitting: Internal Medicine

## 2012-01-30 LAB — CBC WITH DIFFERENTIAL/PLATELET
Basophils Absolute: 0 10*3/uL (ref 0.0–0.1)
HCT: 41.8 % (ref 39.0–52.0)
Hemoglobin: 13.5 g/dL (ref 13.0–17.0)
Lymphocytes Relative: 45 % (ref 12–46)
Monocytes Absolute: 0.2 10*3/uL (ref 0.1–1.0)
Monocytes Relative: 7 % (ref 3–12)
Neutro Abs: 1.2 10*3/uL — ABNORMAL LOW (ref 1.7–7.7)
Neutrophils Relative %: 43 % (ref 43–77)
RDW: 47.6 % — ABNORMAL HIGH (ref 11.5–15.5)
WBC: 2.9 10*3/uL — ABNORMAL LOW (ref 4.0–10.5)

## 2012-01-30 LAB — PROTIME-INR
INR: 1.17 (ref <1.50)
Prothrombin Time: 15.4 seconds — ABNORMAL HIGH (ref 11.6–15.2)

## 2012-01-30 LAB — BASIC METABOLIC PANEL
Calcium: 9.6 mg/dL (ref 8.4–10.5)
Sodium: 137 mEq/L (ref 135–145)

## 2012-01-30 LAB — APTT: aPTT: 32 seconds (ref 24–37)

## 2012-02-04 ENCOUNTER — Other Ambulatory Visit: Payer: Self-pay | Admitting: *Deleted

## 2012-02-04 ENCOUNTER — Encounter (HOSPITAL_COMMUNITY): Payer: Self-pay | Admitting: Pharmacy Technician

## 2012-02-04 DIAGNOSIS — I442 Atrioventricular block, complete: Secondary | ICD-10-CM

## 2012-02-04 MED ORDER — CEFAZOLIN SODIUM 1-5 GM-% IV SOLN: 1.0000 g | INTRAVENOUS | Status: AC

## 2012-02-04 MED ORDER — SODIUM CHLORIDE 0.9 % IR SOLN
80.0000 mg | Status: AC
  Filled 2012-02-04 (×3): qty 2

## 2012-02-04 MED ORDER — SODIUM CHLORIDE 0.45 % IV SOLN
INTRAVENOUS | Status: DC
  Administered 2012-02-05: 10:00:00 via INTRAVENOUS

## 2012-02-04 MED ORDER — CHLORHEXIDINE GLUCONATE 4 % EX LIQD
60.0000 mL | Freq: Once | CUTANEOUS | Status: DC
  Filled 2012-02-04: qty 60

## 2012-02-05 ENCOUNTER — Ambulatory Visit (HOSPITAL_COMMUNITY): Payer: Medicare Other

## 2012-02-05 ENCOUNTER — Ambulatory Visit (HOSPITAL_COMMUNITY)
Admission: RE | Admit: 2012-02-05 | Discharge: 2012-02-05 | Disposition: A | Discharge status: Home / Self Care | Payer: Medicare Other | Source: Ambulatory Visit | Attending: Internal Medicine | Admitting: Internal Medicine

## 2012-02-05 ENCOUNTER — Encounter (HOSPITAL_COMMUNITY)
Admission: RE | Disposition: A | Discharge status: Home / Self Care | Payer: Self-pay | Source: Ambulatory Visit | Attending: Internal Medicine

## 2012-02-05 DIAGNOSIS — N183 Chronic kidney disease, stage 3 unspecified: Secondary | ICD-10-CM | POA: Insufficient documentation

## 2012-02-05 DIAGNOSIS — E785 Hyperlipidemia, unspecified: Secondary | ICD-10-CM | POA: Insufficient documentation

## 2012-02-05 DIAGNOSIS — I442 Atrioventricular block, complete: Secondary | ICD-10-CM

## 2012-02-05 DIAGNOSIS — I129 Hypertensive chronic kidney disease with stage 1 through stage 4 chronic kidney disease, or unspecified chronic kidney disease: Secondary | ICD-10-CM | POA: Insufficient documentation

## 2012-02-05 DIAGNOSIS — J4489 Other specified chronic obstructive pulmonary disease: Secondary | ICD-10-CM | POA: Insufficient documentation

## 2012-02-05 DIAGNOSIS — Z45018 Encounter for adjustment and management of other part of cardiac pacemaker: Secondary | ICD-10-CM | POA: Insufficient documentation

## 2012-02-05 DIAGNOSIS — J449 Chronic obstructive pulmonary disease, unspecified: Secondary | ICD-10-CM | POA: Insufficient documentation

## 2012-02-05 HISTORY — PX: PACEMAKER GENERATOR CHANGE: SHX5481

## 2012-02-05 LAB — SURGICAL PCR SCREEN: MRSA, PCR: NEGATIVE

## 2012-02-05 SURGERY — PACEMAKER GENERATOR CHANGE
Anesthesia: LOCAL

## 2012-02-05 MED ORDER — MUPIROCIN 2 % EX OINT
TOPICAL_OINTMENT | Freq: Once | CUTANEOUS | Status: AC
  Administered 2012-02-05: 1 via NASAL
  Filled 2012-02-05: qty 22

## 2012-02-05 MED ORDER — MIDAZOLAM HCL 5 MG/5ML IJ SOLN
INTRAMUSCULAR | Status: AC
  Filled 2012-02-05: qty 5

## 2012-02-05 MED ORDER — MUPIROCIN 2 % EX OINT
TOPICAL_OINTMENT | CUTANEOUS | Status: AC
  Administered 2012-02-05: 1 via NASAL
  Filled 2012-02-05: qty 22

## 2012-02-05 MED ORDER — LIDOCAINE HCL (PF) 1 % IJ SOLN
INTRAMUSCULAR | Status: AC
  Filled 2012-02-05: qty 60

## 2012-02-05 MED ORDER — CEFAZOLIN SODIUM 1-5 GM-% IV SOLN
INTRAVENOUS | Status: AC
  Filled 2012-02-05: qty 50

## 2012-02-05 MED ORDER — FENTANYL CITRATE 0.05 MG/ML IJ SOLN
INTRAMUSCULAR | Status: AC
  Filled 2012-02-05: qty 2

## 2012-02-05 NOTE — Op Note (Signed)
DDD PPM removal and insertion of a new PPM with pocket revision without immediate complication. R#[id number].

## 2012-02-05 NOTE — Interval H&P Note (Signed)
History and Physical Interval Note:  02/05/2012 12:01 PM  Douglas Joseph  has presented today for surgery, with the diagnosis of end of life generator  The various methods of treatment have been discussed with the patient and family. After consideration of risks, benefits and other options for treatment, the patient has consented to  Procedure(s) (LRB): PACEMAKER GENERATOR CHANGE (N/A) as a surgical intervention .  The patients' history has been reviewed, patient examined, no change in status, stable for surgery.  I have reviewed the patients' chart and labs.  Questions were answered to the patient's satisfaction.     Lewayne Bunting

## 2012-02-05 NOTE — Op Note (Signed)
NAME:  Douglas Joseph, CRESCENZO NO.:  [id number]  MEDICAL RECORD NO.:  [id number]  LOCATION:  [facility]                         FACILITY:  [facility]  PHYSICIAN:  Doylene Canning. Ladona Ridgel, MD    DATE OF BIRTH:  12/27/1924  DATE OF PROCEDURE:  02/05/2012 DATE OF DISCHARGE:  02/05/2012                              OPERATIVE REPORT   SURGEON:  Doylene Canning. Ladona Ridgel, MD  PROCEDURE PERFORMED:  Removal of a previously implanted dual-chamber pacemaker, which had reached elective replacement and insertion of a new dual-chamber pacemaker with pacemaker pocket revision.  INTRODUCTION:  The patient is an 78 year old man with longstanding complete heart block, who underwent permanent pacemaker insertion over 8 years ago.  He is now reached elective replacement indication on his device and is referred for removal of his old device and insertion of a new one.  PROCEDURE:  After informed consent was obtained, the patient was taken to the Diagnostic EP Lab in the fasting state.  After usual preparation and draping, intravenous fentanyl and midazolam was given for sedation. A 30 mL of lidocaine was infiltrated into the left infraclavicular region.  A 5-cm incision was carried out over the old pacemaker insertion site and electrocautery was utilized to dissect down to the pacemaker pocket.  The generator was removed with gentle traction.  The patient's skin was quite thin and it was deemed most appropriate to make a subpectoral pocket.  The old Medtronic Kappa [id number] pacemaker was removed and the new Medtronic Sensia dual-chamber pacemaker, serial number [id number] was connected to the old atrial and ventricular pacing lead. A subpectoral pocket was then made using blunt dissection. Electrocautery was utilized to assure hemostasis.  The Medtronic Sensia dual-chamber pacemaker was connected to the atrial and ventricular leads and placed back in the subpectoral pocket.  The pocket was irrigated with antibiotic  irrigation and the incision was closed with 2-0 and 3-0 Vicryl.  Benzoin and Steri-Strips were painted on the skin, pressure dressing was applied, and the patient was returned to his room in satisfactory condition.  COMPLICATIONS:  There were no immediate procedure complications.  RESULTS:  This demonstrates successful implantation of a new Medtronic dual-chamber pacemaker with relocation of the patient's skin pocket from a subcutaneous pocket to the subpectoral pocket without immediate procedure complication.     Doylene Canning. Ladona Ridgel, MD     GWT/MEDQ  D:  02/05/2012  T:  02/05/2012  Job:  [id number]

## 2012-02-05 NOTE — H&P (View-Only) (Signed)
HPI Douglas Joseph returns today for followup. He has a history of complete heart block and is status post permanent pacemaker insertion. He denies chest pain. Over the past month, the patient has noted increasing fatigue and weakness. He also has dizziness. This coincides with his pacemaker reverting from the DDD pacing mode to the VVI pacing mode. He has not had syncope. No Known Allergies   Current Outpatient Prescriptions  Medication Sig Dispense Refill  . aspirin EC 81 MG tablet Take 81 mg by mouth every morning.      . citalopram (CELEXA) 20 MG tablet Take 20 mg by mouth every morning.       . diltiazem (CARDIZEM CD) 180 MG 24 hr capsule Take 180 mg by mouth every morning.       . dutasteride (AVODART) 0.5 MG capsule Take 0.5 mg by mouth every morning.       . furosemide (LASIX) 20 MG tablet Take 20 mg by mouth every morning. For fluid      . metoprolol tartrate (LOPRESSOR) 25 MG tablet Take 25 mg by mouth at bedtime. 1/2 po bid      . omeprazole (PRILOSEC) 20 MG capsule Take 20 mg by mouth every morning. For acid reflux      . senna-docusate (SENOKOT-S) 8.6-50 MG per tablet Take 1 tablet by mouth at bedtime.       . Tamsulosin HCl (FLOMAX) 0.4 MG CAPS Take 0.8 mg by mouth every morning.       . tiotropium (SPIRIVA) 18 MCG inhalation capsule Place 18 mcg into inhaler and inhale every morning.      . traMADol-acetaminophen (ULTRACET) 37.5-325 MG per tablet Take 1 tablet by mouth 2 (two) times daily.           Past Medical History  Diagnosis Date  . Prostate enlargement   . Hypertension   . Coronary artery disease   . Cardiac pacemaker in situ   . Other and unspecified hyperlipidemia   . Chronic airway obstruction, not elsewhere classified   . Urinary frequency   . Backache, unspecified   . Esophageal reflux   . Chronic kidney disease, stage III (moderate)   . Unspecified constipation   . Hypertrophy of prostate without urinary obstruction and other lower urinary tract symptoms  (LUTS)   . Osteoarthrosis, unspecified whether generalized or localized, unspecified site   . Unspecified asthma   . Anxiety state, unspecified   . Allergic rhinitis, cause unspecified     ROS:   All systems reviewed and negative except as noted in the HPI.   Past Surgical History  Procedure Date  . Pacemaker insertion     10 yrs ago  . Cervical fusion     cervical neck  . Inguinal hernia repair 11/24/2011    Procedure: HERNIA REPAIR INGUINAL ADULT;  Surgeon: William S Bradford, MD;  Location: [facility];  Service: General;  Laterality: Left;     Family History  Problem Relation Age of Onset  . Lung cancer Daughter   . Anesthesia problems Neg Hx   . Hypotension Neg Hx   . Malignant hyperthermia Neg Hx   . Pseudochol deficiency Neg Hx      History   Social History  . Marital Status: Married    Spouse Name: N/A    Number of Children: N/A  . Years of Education: N/A   Occupational History  . [employer]    Social History Main Topics  . Smoking status: Former Smoker -- 2.0   packs/day for 20 years    Types: Cigarettes    Quit date: 11/21/1958  . Smokeless tobacco: Not on file  . Alcohol Use: No  . Drug Use: No  . Sexually Active: Yes    Birth Control/ Protection: None   Other Topics Concern  . Not on file   Social History Narrative   MarriedLives with wife and grandson10th grade education     BP 88/70  Pulse 64  Resp 16  Ht 5' 6" (1.676 m)  Wt 162 lb (73.483 kg)  BMI 26.15 kg/m2  Physical Exam:  Elderly but Well appearing NAD HEENT: Unremarkable Neck:  7 cm JVD, no thyromegally Lungs:  Clear except for basilar rales. No wheezes or rhonchi. No increased work of breathing. HEART:  Regular rate rhythm, no murmurs, no rubs, no clicks Abd:  soft, positive bowel sounds, no organomegally, no rebound, no guarding Ext:  2 plus pulses, no edema, no cyanosis, no clubbing Skin:  No rashes no nodules Neuro:  CN II through XII intact, motor grossly intact  DEVICE    Normal device function.  See PaceArt for details. Device at elective replacement  Assess/Plan:   

## 2012-02-06 ENCOUNTER — Encounter: Payer: Self-pay | Admitting: *Deleted

## 2012-02-16 ENCOUNTER — Encounter: Payer: Self-pay | Admitting: Internal Medicine

## 2012-02-16 ENCOUNTER — Ambulatory Visit (INDEPENDENT_AMBULATORY_CARE_PROVIDER_SITE_OTHER): Payer: Medicare Other | Admitting: Internal Medicine

## 2012-02-16 VITALS — BP 148/82 | HR 90 | Resp 18 | Ht 66.0 in | Wt 171.0 lb

## 2012-02-16 DIAGNOSIS — Z950 Presence of cardiac pacemaker: Secondary | ICD-10-CM

## 2012-02-16 DIAGNOSIS — E785 Hyperlipidemia, unspecified: Secondary | ICD-10-CM

## 2012-02-16 DIAGNOSIS — I442 Atrioventricular block, complete: Secondary | ICD-10-CM

## 2012-02-16 DIAGNOSIS — I10 Essential (primary) hypertension: Secondary | ICD-10-CM

## 2012-02-16 LAB — PACEMAKER DEVICE OBSERVATION
AL AMPLITUDE: 4 mv
AL IMPEDENCE PM: 498 Ohm
ATRIAL PACING PM: 62
RV LEAD IMPEDENCE PM: 447 Ohm
RV LEAD THRESHOLD: 0.5 V
VENTRICULAR PACING PM: 100

## 2012-02-16 NOTE — Assessment & Plan Note (Signed)
His blood pressure is elevated to he is instructed to continue his current medical therapy and maintain a low-sodium diet.day but he notes that he has not taken his morning meds.

## 2012-02-16 NOTE — Assessment & Plan Note (Signed)
He will continue to maintain a low-fat diet. He'll continue his current medical therapy.

## 2012-02-16 NOTE — Assessment & Plan Note (Signed)
His device is working normally. We'll plan to recheck in 6 months.

## 2012-02-16 NOTE — Patient Instructions (Signed)
**Note De-Identified Tyrin Herbers Obfuscation** Your physician recommends that you continue on your current medications as directed. Please refer to the Current Medication list given to you today.  Your physician recommends that you schedule a follow-up appointment in: device check in 6 months and with Dr. Ladona Ridgel in 1 year

## 2012-02-16 NOTE — Progress Notes (Signed)
HPI Mr. Pollack returns today for followup. He is a very pleasant 78 year old man with hypertension, coronary disease, symptomatic bradycardia, status post permanent pacemaker insertion. The patient denies chest pain or shortness of breath. Several weeks ago, he underwent pacemaker generator change. He denies syncope. No peripheral edema. His appetite is good. No Known Allergies   Current Outpatient Prescriptions  Medication Sig Dispense Refill  . citalopram (CELEXA) 20 MG tablet Take 20 mg by mouth every morning.       . diltiazem (CARDIZEM CD) 180 MG 24 hr capsule Take 180 mg by mouth every morning.       . dutasteride (AVODART) 0.5 MG capsule Take 0.5 mg by mouth every morning.       . furosemide (LASIX) 20 MG tablet Take 20 mg by mouth every morning. For fluid      . metoprolol tartrate (LOPRESSOR) 25 MG tablet Take 12.5 mg by mouth 2 (two) times daily.       Marland Kitchen omeprazole (PRILOSEC) 20 MG capsule Take 20 mg by mouth every morning. For acid reflux      . senna-docusate (SENOKOT-S) 8.6-50 MG per tablet Take 1 tablet by mouth at bedtime.       . Tamsulosin HCl (FLOMAX) 0.4 MG CAPS Take 0.4 mg by mouth daily.       Marland Kitchen tiotropium (SPIRIVA) 18 MCG inhalation capsule Place 18 mcg into inhaler and inhale every morning.      . traMADol-acetaminophen (ULTRACET) 37.5-325 MG per tablet Take 1 tablet by mouth 2 (two) times daily.           Past Medical History  Diagnosis Date  . Prostate enlargement   . Hypertension   . Coronary artery disease   . Cardiac pacemaker in situ   . Other and unspecified hyperlipidemia   . Chronic airway obstruction, not elsewhere classified   . Urinary frequency   . Backache, unspecified   . Esophageal reflux   . Chronic kidney disease, stage III (moderate)   . Unspecified constipation   . Hypertrophy of prostate without urinary obstruction and other lower urinary tract symptoms (LUTS)   . Osteoarthrosis, unspecified whether generalized or localized, unspecified  site   . Unspecified asthma   . Anxiety state, unspecified   . Allergic rhinitis, cause unspecified   . Atrioventricular block, complete     ROS:   All systems reviewed and negative except as noted in the HPI.   Past Surgical History  Procedure Date  . Pacemaker insertion     10 yrs ago  . Cervical fusion     cervical neck  . Inguinal hernia repair 11/24/2011    Procedure: HERNIA REPAIR INGUINAL ADULT;  Surgeon: Marlane Hatcher, MD;  Location: [facility];  Service: General;  Laterality: Left;     Family History  Problem Relation Age of Onset  . Lung cancer Daughter   . Anesthesia problems Neg Hx   . Hypotension Neg Hx   . Malignant hyperthermia Neg Hx   . Pseudochol deficiency Neg Hx      History   Social History  . Marital Status: Married    Spouse Name: N/A    Number of Children: N/A  . Years of Education: N/A   Occupational History  . Famer    Social History Main Topics  . Smoking status: Former Smoker -- 2.0 packs/day for 20 years    Types: Cigarettes    Quit date: 11/21/1958  . Smokeless tobacco: Not on file  .  Alcohol Use: No  . Drug Use: No  . Sexually Active: Yes    Birth Control/ Protection: None   Other Topics Concern  . Not on file   Social History Narrative   MarriedLives with wife and grandson10th grade education     BP 148/82  Pulse 90  Resp 18  Ht 5\' 6"  (1.676 m)  Wt 171 lb (77.565 kg)  BMI 27.60 kg/m2  Physical Exam:  Well appearing elderly man, NAD HEENT: Unremarkable Neck:  No JVD, no thyromegally Lungs:  Clear with no wheezes, rales, or rhonchi. Well-healed pacemaker incision. HEART:  Regular rate rhythm, no murmurs, no rubs, no clicks Abd:  soft, positive bowel sounds, no organomegally, no rebound, no guarding Ext:  2 plus pulses, no edema, no cyanosis, no clubbing Skin:  No rashes no nodules Neuro:  CN II through XII intact, motor grossly intact   DEVICE  Normal device function.  See PaceArt for details.    Assess/Plan:

## 2012-06-09 ENCOUNTER — Telehealth: Payer: Self-pay | Admitting: Internal Medicine

## 2012-06-09 NOTE — Telephone Encounter (Signed)
06-09-12 called pt and he requested I call back to talk with his wife, sent 2 nd recall letter for pacer ck in [place]/mt

## 2013-03-09 ENCOUNTER — Encounter: Payer: Self-pay | Admitting: Internal Medicine

## 2013-03-09 ENCOUNTER — Ambulatory Visit (INDEPENDENT_AMBULATORY_CARE_PROVIDER_SITE_OTHER): Payer: Medicare Other | Admitting: Internal Medicine

## 2013-03-09 VITALS — BP 110/60 | HR 73 | Ht 66.0 in | Wt 173.0 lb

## 2013-03-09 DIAGNOSIS — I10 Essential (primary) hypertension: Secondary | ICD-10-CM

## 2013-03-09 DIAGNOSIS — I442 Atrioventricular block, complete: Secondary | ICD-10-CM

## 2013-03-09 DIAGNOSIS — Z950 Presence of cardiac pacemaker: Secondary | ICD-10-CM

## 2013-03-09 LAB — PACEMAKER DEVICE OBSERVATION
ATRIAL PACING PM: 37
BAMS-0001: 150 {beats}/min
RV LEAD IMPEDENCE PM: 432 Ohm
RV LEAD THRESHOLD: 0.75 V
VENTRICULAR PACING PM: 96

## 2013-03-09 NOTE — Progress Notes (Signed)
HPI Douglas Joseph returns today for followup. He is a very pleasant 79 room man with a history of complete heart block, status post permanent pacemaker insertion, hypertension, and chronic renal insufficiency. In the interim, he has been stable. He denies chest pain, shortness of breath, or syncope. He does admit to being weak. He has peripheral edema. He has trouble with urination. At times, he refuses to take his diuretic. No Known Allergies   Current Outpatient Prescriptions  Medication Sig Dispense Refill  . citalopram (CELEXA) 20 MG tablet Take 20 mg by mouth every morning.       . diltiazem (CARDIZEM CD) 180 MG 24 hr capsule Take 180 mg by mouth every morning.       . dutasteride (AVODART) 0.5 MG capsule Take 0.5 mg by mouth every morning.       . meclizine (ANTIVERT) 25 MG tablet Take 25 mg by mouth 3 (three) times daily as needed.      Marland Kitchen omeprazole (PRILOSEC) 20 MG capsule Take 20 mg by mouth every morning. For acid reflux      . senna-docusate (SENOKOT-S) 8.6-50 MG per tablet Take 1 tablet by mouth at bedtime.       . Tamsulosin HCl (FLOMAX) 0.4 MG CAPS Take 0.4 mg by mouth daily.       Marland Kitchen tiotropium (SPIRIVA) 18 MCG inhalation capsule Place 18 mcg into inhaler and inhale every morning.      . traMADol-acetaminophen (ULTRACET) 37.5-325 MG per tablet Take 1 tablet by mouth 2 (two) times daily.         No current facility-administered medications for this visit.     Past Medical History  Diagnosis Date  . Prostate enlargement   . Hypertension   . Coronary artery disease   . Cardiac pacemaker in situ   . Other and unspecified hyperlipidemia   . Chronic airway obstruction, not elsewhere classified   . Urinary frequency   . Backache, unspecified   . Esophageal reflux   . Chronic kidney disease, stage III (moderate)   . Unspecified constipation   . Hypertrophy of prostate without urinary obstruction and other lower urinary tract symptoms (LUTS)   . Osteoarthrosis, unspecified  whether generalized or localized, unspecified site   . Unspecified asthma(493.90)   . Anxiety state, unspecified   . Allergic rhinitis, cause unspecified   . Atrioventricular block, complete     ROS:   All systems reviewed and negative except as noted in the HPI.   Past Surgical History  Procedure Laterality Date  . Pacemaker insertion      10 yrs ago  . Cervical fusion      cervical neck  . Inguinal hernia repair  11/24/2011    Procedure: HERNIA REPAIR INGUINAL ADULT;  Surgeon: Douglas Hatcher, MD;  Location: [facility];  Service: General;  Laterality: Left;     Family History  Problem Relation Age of Onset  . Lung cancer Daughter   . Anesthesia problems Neg Hx   . Hypotension Neg Hx   . Malignant hyperthermia Neg Hx   . Pseudochol deficiency Neg Hx      History   Social History  . Marital Status: Married    Spouse Name: N/A    Number of Children: N/A  . Years of Education: N/A   Occupational History  . Famer    Social History Main Topics  . Smoking status: Former Smoker -- 2.00 packs/day for 20 years    Types: Cigarettes  Quit date: 11/21/1958  . Smokeless tobacco: Not on file  . Alcohol Use: No  . Drug Use: No  . Sexually Active: Yes    Birth Control/ Protection: None   Other Topics Concern  . Not on file   Social History Narrative   Married   Lives with wife and grandson   10th grade education     BP 110/60  Pulse 73  Ht 5\' 6"  (1.676 m)  Wt 173 lb (78.472 kg)  BMI 27.94 kg/m2  Physical Exam:  Well appearing elderly man,NAD HEENT: Unremarkable Neck:  7 cm JVD, no thyromegally Back:  No CVA tenderness Lungs:  Clear with no wheezes, rales, or rhonchi. HEART:  Regular rate rhythm, no murmurs, no rubs, no clicks Abd:  soft, positive bowel sounds, no organomegally, no rebound, no guarding Ext:  2 plus pulses, no edema, no cyanosis, no clubbing Skin:  No rashes no nodules Neuro:  CN II through XII intact, motor grossly intact  EKG -  normal sinus rhythm with AV sequential pacing  DEVICE  Normal device function.  See PaceArt for details.   Assess/Plan:

## 2013-03-09 NOTE — Assessment & Plan Note (Signed)
His Medtronic dual-chamber pacemaker is working normally. We'll plan to recheck in several months. 

## 2013-03-09 NOTE — Assessment & Plan Note (Signed)
His blood pressure is well controlled today. No change in medical therapy. He is encouraged to maintain a low-sodium diet. If his legs swell up, I've recommended that he take his diuretic as prescribed.

## 2013-03-09 NOTE — Patient Instructions (Addendum)
Your physician recommends that you schedule a follow-up appointment in: 1 YEAR WITH DR Ladona Ridgel AND 6 MONTHS DEVICE CHECK   Your physician recommends that you continue on your current medications as directed. Please refer to the Current Medication list given to you today.

## 2013-09-12 ENCOUNTER — Inpatient Hospital Stay (HOSPITAL_COMMUNITY): Payer: Medicare Other

## 2013-09-12 ENCOUNTER — Emergency Department (HOSPITAL_COMMUNITY): Payer: Medicare Other

## 2013-09-12 ENCOUNTER — Inpatient Hospital Stay (HOSPITAL_COMMUNITY)
Admission: EM | Admit: 2013-09-12 | Discharge: 2013-09-15 | DRG: 208 | Disposition: E | Payer: Medicare Other | Attending: Pulmonary Disease | Admitting: Pulmonary Disease

## 2013-09-12 ENCOUNTER — Encounter (HOSPITAL_COMMUNITY): Payer: Self-pay | Admitting: Emergency Medicine

## 2013-09-12 DIAGNOSIS — E872 Acidosis, unspecified: Secondary | ICD-10-CM | POA: Diagnosis present

## 2013-09-12 DIAGNOSIS — Z950 Presence of cardiac pacemaker: Secondary | ICD-10-CM | POA: Diagnosis present

## 2013-09-12 DIAGNOSIS — R569 Unspecified convulsions: Secondary | ICD-10-CM

## 2013-09-12 DIAGNOSIS — J9601 Acute respiratory failure with hypoxia: Secondary | ICD-10-CM

## 2013-09-12 DIAGNOSIS — Z87891 Personal history of nicotine dependence: Secondary | ICD-10-CM

## 2013-09-12 DIAGNOSIS — N400 Enlarged prostate without lower urinary tract symptoms: Secondary | ICD-10-CM | POA: Diagnosis present

## 2013-09-12 DIAGNOSIS — J9600 Acute respiratory failure, unspecified whether with hypoxia or hypercapnia: Principal | ICD-10-CM

## 2013-09-12 DIAGNOSIS — I129 Hypertensive chronic kidney disease with stage 1 through stage 4 chronic kidney disease, or unspecified chronic kidney disease: Secondary | ICD-10-CM | POA: Diagnosis present

## 2013-09-12 DIAGNOSIS — J4489 Other specified chronic obstructive pulmonary disease: Secondary | ICD-10-CM | POA: Diagnosis present

## 2013-09-12 DIAGNOSIS — K219 Gastro-esophageal reflux disease without esophagitis: Secondary | ICD-10-CM | POA: Diagnosis present

## 2013-09-12 DIAGNOSIS — I4891 Unspecified atrial fibrillation: Secondary | ICD-10-CM | POA: Diagnosis present

## 2013-09-12 DIAGNOSIS — G931 Anoxic brain damage, not elsewhere classified: Secondary | ICD-10-CM | POA: Diagnosis present

## 2013-09-12 DIAGNOSIS — N183 Chronic kidney disease, stage 3 unspecified: Secondary | ICD-10-CM | POA: Diagnosis present

## 2013-09-12 DIAGNOSIS — I442 Atrioventricular block, complete: Secondary | ICD-10-CM

## 2013-09-12 DIAGNOSIS — Y92009 Unspecified place in unspecified non-institutional (private) residence as the place of occurrence of the external cause: Secondary | ICD-10-CM

## 2013-09-12 DIAGNOSIS — S12600A Unspecified displaced fracture of seventh cervical vertebra, initial encounter for closed fracture: Secondary | ICD-10-CM | POA: Diagnosis present

## 2013-09-12 DIAGNOSIS — I472 Ventricular tachycardia, unspecified: Secondary | ICD-10-CM | POA: Diagnosis present

## 2013-09-12 DIAGNOSIS — I469 Cardiac arrest, cause unspecified: Secondary | ICD-10-CM | POA: Diagnosis present

## 2013-09-12 DIAGNOSIS — J449 Chronic obstructive pulmonary disease, unspecified: Secondary | ICD-10-CM | POA: Diagnosis present

## 2013-09-12 DIAGNOSIS — G9340 Encephalopathy, unspecified: Secondary | ICD-10-CM | POA: Diagnosis present

## 2013-09-12 DIAGNOSIS — I4729 Other ventricular tachycardia: Secondary | ICD-10-CM | POA: Diagnosis present

## 2013-09-12 DIAGNOSIS — Z79899 Other long term (current) drug therapy: Secondary | ICD-10-CM

## 2013-09-12 DIAGNOSIS — N179 Acute kidney failure, unspecified: Secondary | ICD-10-CM | POA: Diagnosis present

## 2013-09-12 DIAGNOSIS — I10 Essential (primary) hypertension: Secondary | ICD-10-CM | POA: Diagnosis present

## 2013-09-12 DIAGNOSIS — I2510 Atherosclerotic heart disease of native coronary artery without angina pectoris: Secondary | ICD-10-CM | POA: Diagnosis present

## 2013-09-12 DIAGNOSIS — E785 Hyperlipidemia, unspecified: Secondary | ICD-10-CM | POA: Diagnosis present

## 2013-09-12 DIAGNOSIS — W06XXXA Fall from bed, initial encounter: Secondary | ICD-10-CM | POA: Diagnosis present

## 2013-09-12 HISTORY — DX: Cardiac arrest, cause unspecified: I46.9

## 2013-09-12 LAB — CK: Total CK: 2176 U/L — ABNORMAL HIGH (ref 7–232)

## 2013-09-12 LAB — BLOOD GAS, ARTERIAL
Drawn by: 12971
MECHVT: 0.5 mL
TCO2: 15.1 mmol/L (ref 0–100)
pCO2 arterial: 44.9 mmHg (ref 35.0–45.0)
pH, Arterial: 7.162 — CL (ref 7.350–7.450)
pO2, Arterial: 210 mmHg — ABNORMAL HIGH (ref 80.0–100.0)

## 2013-09-12 LAB — CBC WITH DIFFERENTIAL/PLATELET
Basophils Absolute: 0 10*3/uL (ref 0.0–0.1)
HCT: 32.3 % — ABNORMAL LOW (ref 39.0–52.0)
Lymphocytes Relative: 6 % — ABNORMAL LOW (ref 12–46)
Lymphs Abs: 0.5 10*3/uL — ABNORMAL LOW (ref 0.7–4.0)
Neutro Abs: 6.8 10*3/uL (ref 1.7–7.7)
Neutrophils Relative %: 87 % — ABNORMAL HIGH (ref 43–77)
Platelets: 111 10*3/uL — ABNORMAL LOW (ref 150–400)
RBC: 3.69 MIL/uL — ABNORMAL LOW (ref 4.22–5.81)
RDW: 13.5 % (ref 11.5–15.5)
WBC: 7.8 10*3/uL (ref 4.0–10.5)

## 2013-09-12 LAB — COMPREHENSIVE METABOLIC PANEL
ALT: 23 U/L (ref 0–53)
AST: 82 U/L — ABNORMAL HIGH (ref 0–37)
Alkaline Phosphatase: 77 U/L (ref 39–117)
CO2: 22 mEq/L (ref 19–32)
Calcium: 8.5 mg/dL (ref 8.4–10.5)
Chloride: 104 mEq/L (ref 96–112)
GFR calc non Af Amer: 49 mL/min — ABNORMAL LOW (ref >90)
Glucose, Bld: 196 mg/dL — ABNORMAL HIGH (ref 70–99)
Potassium: 3.9 mEq/L (ref 3.5–5.1)
Sodium: 139 mEq/L (ref 135–145)
Total Protein: 6.9 g/dL (ref 6.0–8.3)

## 2013-09-12 LAB — POCT I-STAT, CHEM 8
Creatinine, Ser: 1.6 mg/dL — ABNORMAL HIGH (ref 0.50–1.35)
Glucose, Bld: 190 mg/dL — ABNORMAL HIGH (ref 70–99)
HCT: 41 % (ref 39.0–52.0)
Hemoglobin: 13.9 g/dL (ref 13.0–17.0)
Potassium: 4.7 mEq/L (ref 3.5–5.1)
Sodium: 139 mEq/L (ref 135–145)
TCO2: 21 mmol/L (ref 0–100)

## 2013-09-12 LAB — PRO B NATRIURETIC PEPTIDE: Pro B Natriuretic peptide (BNP): 3051 pg/mL — ABNORMAL HIGH (ref 0–450)

## 2013-09-12 LAB — TYPE AND SCREEN
ABO/RH(D): O POS
Antibody Screen: NEGATIVE

## 2013-09-12 LAB — POCT I-STAT 3, ART BLOOD GAS (G3+)
Acid-base deficit: 3 mmol/L — ABNORMAL HIGH (ref 0.0–2.0)
Bicarbonate: 22.2 mEq/L (ref 20.0–24.0)
Patient temperature: 98.6
pCO2 arterial: 40.7 mmHg (ref 35.0–45.0)
pH, Arterial: 7.344 — ABNORMAL LOW (ref 7.350–7.450)

## 2013-09-12 LAB — POCT I-STAT TROPONIN I

## 2013-09-12 LAB — PROTIME-INR
INR: 1.36 (ref 0.00–1.49)
Prothrombin Time: 16.4 seconds — ABNORMAL HIGH (ref 11.6–15.2)

## 2013-09-12 LAB — TROPONIN I: Troponin I: 0.42 ng/mL (ref <0.30)

## 2013-09-12 LAB — CK TOTAL AND CKMB (NOT AT ARMC)
Relative Index: 1.8 (ref 0.0–2.5)
Total CK: 2853 U/L — ABNORMAL HIGH (ref 7–232)

## 2013-09-12 LAB — MRSA PCR SCREENING: MRSA by PCR: NEGATIVE

## 2013-09-12 MED ORDER — EPINEPHRINE HCL 0.1 MG/ML IJ SOSY
PREFILLED_SYRINGE | INTRAMUSCULAR | Status: AC | PRN
  Administered 2013-09-12: 1 mg via INTRAVENOUS

## 2013-09-12 MED ORDER — SODIUM CHLORIDE 0.9 % IV SOLN
1000.0000 mg | Freq: Once | INTRAVENOUS | Status: DC
  Filled 2013-09-12: qty 20

## 2013-09-12 MED ORDER — SODIUM CHLORIDE 0.9 % IV SOLN
1000.0000 mg | Freq: Once | INTRAVENOUS | Status: AC
  Administered 2013-09-12: 1000 mg via INTRAVENOUS
  Filled 2013-09-12: qty 10

## 2013-09-12 MED ORDER — HEPARIN SODIUM (PORCINE) 5000 UNIT/ML IJ SOLN
5000.0000 [IU] | Freq: Three times a day (TID) | INTRAMUSCULAR | Status: DC
  Administered 2013-09-12: 5000 [IU] via SUBCUTANEOUS
  Filled 2013-09-12 (×6): qty 1

## 2013-09-12 MED ORDER — ROCURONIUM BROMIDE 50 MG/5ML IV SOLN
INTRAVENOUS | Status: AC
  Filled 2013-09-12: qty 2

## 2013-09-12 MED ORDER — ASPIRIN 81 MG PO CHEW: 324.0000 mg | CHEWABLE_TABLET | ORAL | Status: AC

## 2013-09-12 MED ORDER — SODIUM CHLORIDE 0.9 % IV SOLN
INTRAVENOUS | Status: DC
  Filled 2013-09-12 (×3): qty 1000

## 2013-09-12 MED ORDER — NOREPINEPHRINE BITARTRATE 1 MG/ML IJ SOLN
2.0000 ug/min | INTRAVENOUS | Status: DC
  Administered 2013-09-12: 20 ug/min via INTRAVENOUS
  Administered 2013-09-12: 5 ug/min via INTRAVENOUS
  Filled 2013-09-12 (×3): qty 4

## 2013-09-12 MED ORDER — ASPIRIN 300 MG RE SUPP
300.0000 mg | RECTAL | Status: AC
  Administered 2013-09-12: 300 mg via RECTAL
  Filled 2013-09-12: qty 1

## 2013-09-12 MED ORDER — SODIUM CHLORIDE 0.9 % IV SOLN
500.0000 mg | Freq: Two times a day (BID) | INTRAVENOUS | Status: DC
  Administered 2013-09-12: 500 mg via INTRAVENOUS
  Filled 2013-09-12 (×2): qty 5

## 2013-09-12 MED ORDER — SODIUM BICARBONATE 8.4 % IV SOLN
INTRAVENOUS | Status: DC
  Filled 2013-09-12: qty 150

## 2013-09-12 MED ORDER — PANTOPRAZOLE SODIUM 40 MG IV SOLR
40.0000 mg | Freq: Every day | INTRAVENOUS | Status: DC
  Administered 2013-09-12: 40 mg via INTRAVENOUS
  Filled 2013-09-12 (×2): qty 40

## 2013-09-12 MED ORDER — LORAZEPAM 2 MG/ML IJ SOLN
INTRAMUSCULAR | Status: AC
  Administered 2013-09-12: 12:00:00
  Filled 2013-09-12: qty 1

## 2013-09-12 MED ORDER — SUCCINYLCHOLINE CHLORIDE 20 MG/ML IJ SOLN
INTRAMUSCULAR | Status: AC
  Filled 2013-09-12: qty 1

## 2013-09-12 MED ORDER — FENTANYL CITRATE 0.05 MG/ML IJ SOLN: 25.0000 ug | INTRAMUSCULAR | Status: DC | PRN

## 2013-09-12 MED ORDER — SUCCINYLCHOLINE CHLORIDE 20 MG/ML IJ SOLN
125.0000 mg | Freq: Once | INTRAMUSCULAR | Status: AC
  Administered 2013-09-12: 09:00:00 via INTRAVENOUS

## 2013-09-12 MED ORDER — VALPROATE SODIUM 500 MG/5ML IV SOLN
1500.0000 mg | Freq: Once | INTRAVENOUS | Status: DC
  Filled 2013-09-12: qty 15

## 2013-09-12 MED ORDER — LORAZEPAM 2 MG/ML IJ SOLN
INTRAMUSCULAR | Status: AC
  Filled 2013-09-12: qty 1

## 2013-09-12 MED ORDER — ETOMIDATE 2 MG/ML IV SOLN
INTRAVENOUS | Status: AC
  Filled 2013-09-12: qty 20

## 2013-09-12 MED ORDER — DEXTROSE-NACL 5-0.9 % IV SOLN
INTRAVENOUS | Status: DC
  Administered 2013-09-12: 16:00:00 via INTRAVENOUS

## 2013-09-12 MED ORDER — ROCURONIUM BROMIDE 50 MG/5ML IV SOLN
50.0000 mg | Freq: Once | INTRAVENOUS | Status: AC
  Administered 2013-09-12: 50 mg via INTRAVENOUS
  Filled 2013-09-12: qty 5

## 2013-09-12 MED ORDER — LIDOCAINE HCL (CARDIAC) 20 MG/ML IV SOLN
INTRAVENOUS | Status: AC
  Filled 2013-09-12: qty 5

## 2013-09-12 MED ORDER — LORAZEPAM 2 MG/ML IJ SOLN
1.0000 mg | Freq: Two times a day (BID) | INTRAMUSCULAR | Status: DC | PRN
  Administered 2013-09-12: 2 mg via INTRAVENOUS
  Filled 2013-09-12: qty 1

## 2013-09-12 MED ORDER — VALPROATE SODIUM 500 MG/5ML IV SOLN
1500.0000 mg | INTRAVENOUS | Status: AC
  Administered 2013-09-12: 1500 mg via INTRAVENOUS
  Filled 2013-09-12: qty 15

## 2013-09-12 MED ORDER — LORAZEPAM 2 MG/ML IJ SOLN
1.0000 mg | Freq: Once | INTRAMUSCULAR | Status: AC
  Administered 2013-09-12: 1 mg via INTRAVENOUS

## 2013-09-12 MED ORDER — SODIUM CHLORIDE 0.9 % IV SOLN: 250.0000 mL | INTRAVENOUS | Status: DC | PRN

## 2013-09-12 MED ORDER — SODIUM CHLORIDE 0.9 % IV BOLUS (SEPSIS)
1000.0000 mL | Freq: Once | INTRAVENOUS | Status: AC
  Administered 2013-09-12: 1000 mL via INTRAVENOUS

## 2013-09-12 MED ORDER — SODIUM BICARBONATE 8.4 % IV SOLN
INTRAVENOUS | Status: AC
  Filled 2013-09-12: qty 150

## 2013-09-12 NOTE — ED Provider Notes (Signed)
CSN: [id number]     Arrival date & time 2013/10/06  4132 History  This chart was scribed for [name], MD by Smiley Houseman, ED Scribe. The patient was seen in room APA01/[facility]01. Patient's care was started at 8:56 AM.    Chief Complaint  Patient presents with  . Cardiac Arrest   Level 5 Caveat - CPR  The history is provided by the EMS personnel. No language interpreter was used.   HPI Comments: Douglas Joseph is a 79 y.o. male who presents to the Emergency Department complaining of sudden cardiac arrest.  Pt went to bed last night around 6:00PM, which his wife states is unusual for him.  Wife states he got out of bed to go to the bathroom around 2:00AM and fell.  Wife states pt was responsive, but did not want to call the EMS.  He layed in the floor until 8:00, when wife noticed something was wrong.  Upon EMS arrival, pt was alert and became unresponsive in route.  CPR initiated at that point.  Upon speaking to wife, pt wishes to have medical intervention.     Past Medical History  Diagnosis Date  . Prostate enlargement   . Hypertension   . Coronary artery disease   . Cardiac pacemaker in situ   . Other and unspecified hyperlipidemia   . Chronic airway obstruction, not elsewhere classified   . Urinary frequency   . Backache, unspecified   . Esophageal reflux   . Chronic kidney disease, stage III (moderate)   . Unspecified constipation   . Hypertrophy of prostate without urinary obstruction and other lower urinary tract symptoms (LUTS)   . Osteoarthrosis, unspecified whether generalized or localized, unspecified site   . Unspecified asthma(493.90)   . Anxiety state, unspecified   . Allergic rhinitis, cause unspecified   . Atrioventricular block, complete    Past Surgical History  Procedure Laterality Date  . Pacemaker insertion      10 yrs ago  . Cervical fusion      cervical neck  . Inguinal hernia repair  11/24/2011    Procedure: HERNIA REPAIR INGUINAL ADULT;  Surgeon:  Marlane Hatcher, MD;  Location: [facility];  Service: General;  Laterality: Left;   Family History  Problem Relation Age of Onset  . Lung cancer Daughter   . Anesthesia problems Neg Hx   . Hypotension Neg Hx   . Malignant hyperthermia Neg Hx   . Pseudochol deficiency Neg Hx    History  Substance Use Topics  . Smoking status: Former Smoker -- 2.00 packs/day for 20 years    Types: Cigarettes    Quit date: 11/21/1958  . Smokeless tobacco: Not on file  . Alcohol Use: No    Review of Systems  Unable to perform ROS: Other  CPR  Allergies  Review of patient's allergies indicates no known allergies.  Home Medications   Current Outpatient Rx  Name  Route  Sig  Dispense  Refill  . citalopram (CELEXA) 20 MG tablet   Oral   Take 20 mg by mouth every morning.          . diltiazem (CARDIZEM CD) 180 MG 24 hr capsule   Oral   Take 180 mg by mouth every morning.          . dutasteride (AVODART) 0.5 MG capsule   Oral   Take 0.5 mg by mouth every morning.          . meclizine (ANTIVERT) 25 MG  tablet   Oral   Take 25 mg by mouth 3 (three) times daily as needed.         Marland Kitchen omeprazole (PRILOSEC) 20 MG capsule   Oral   Take 20 mg by mouth every morning. For acid reflux         . senna-docusate (SENOKOT-S) 8.6-50 MG per tablet   Oral   Take 1 tablet by mouth at bedtime.          . Tamsulosin HCl (FLOMAX) 0.4 MG CAPS   Oral   Take 0.4 mg by mouth daily.          Marland Kitchen tiotropium (SPIRIVA) 18 MCG inhalation capsule   Inhalation   Place 18 mcg into inhaler and inhale every morning.         . traMADol-acetaminophen (ULTRACET) 37.5-325 MG per tablet   Oral   Take 1 tablet by mouth 2 (two) times daily.            BP 0/0  Pulse 120  Resp 11 Physical Exam  Nursing note and vitals reviewed. Constitutional: He appears well-developed and well-nourished.  HENT:  Abrasions to his forehead and chin. No other signs of trauma.  Eyes:  Cataracts to bilateral  eyes. Unable to assess pupil response due to cataracts.  Neck: No tracheal deviation present.  Cardiovascular:  No cardiac activity.  Pulmonary/Chest:  No breath sounds noted with ascultation.   Abdominal: He exhibits no distension.  Musculoskeletal: He exhibits no edema.  No signs of trauma to extremities.  No spontaneous movement.  Neurological:  Unresponsive.  Skin: Skin is warm and dry.    ED Course  Procedures (including critical care time) DIAGNOSTIC STUDIES:  COORDINATION OF CARE: 8:50 AM-Pt arrived.  ED staff takes overfrom EMS. CPR initiated for two minutes1mg  epiniphrine administered. Cardiopulmonary Resuscitation (CPR) Procedure Note Directed/Performed by: [name], MD I personally directed ancillary staff and/or performed CPR in an effort to regain return of spontaneous circulation and to maintain cardiac, neuro and systemic perfusion.   8:55 AM-CPR stopped.  [place] to check cardiac activity. Initially, slow activity noted. While performing cardiac [place], cardiac activity returned.  Peripheral pulses checked and are strong.    9:00 AM-Spoke to family, who stated pt wishes for medical intervention.  EMERGENT INTUBATION PROCEDURE NOTE INDICATION: respiratory failure  TECHNIQUE: Unable to obtain consent because of emergent medical necessity.  After pre-oxygenating the patient for 3 minutes, a modified rapid-sequence induction was performed using 125 mg succinylcholine with cricoid pressure. Using a Glidescope laryngoscope blade and 8.35mm cuffed endotracheal tube was placed and secured on first pass. Cervical collar in place through out procedure.  Placement was confirmed with by auscultation. Good breath sounds and color change noted. End tidal co2 with good color change.  COMPLICATIONS: None. The patient tolerated the procedure well with no complications. POST PROCEDURE CXR: good tube placement Labs Review Labs Reviewed - No data to display Imaging Review No results  found.  EKG Interpretation    Date/Time:  Monday September 12 2013 08:58:58 EST Ventricular Rate:  105 PR Interval:    QRS Duration: 128 QT Interval:  364 QTC Calculation: 481 R Axis:   -51 Text Interpretation:  Undetermined rhythm Left axis deviation Non-specific intra-ventricular conduction block Inferior infarct , age undetermined Marked T wave abnormality, consider anterolateral ischemia Abnormal ECG When compared with ECG of 21-Nov-2011 09:23, Current undetermined rhythm precludes rhythm comparison, needs review ST no longer elevated in Inferior leads ST more depressed Lateral leads T  wave inversion more evident in Anterolateral leads Confirmed by Daneka Lantigua MD, Tani Virgo ([id number]) on 08/28/2013 10:48:49 AM            MDM  No diagnosis found. This is an 89 rolled male who is at home with his wife and fell at 2 AM. Patient states he was unable to get up off the floor about refusal her call EMS. She eventually was able to call EMS was morning with his permission and they arrived to find him awake and alert although he had abrasions on his face. In route to the hospital the patient became unresponsive but continued to have a them on the monitor and pulses per EMS. They report prehospital blood sugar of 121. On initial evaluation patient was pulseless and apneic and had CPR initiated immediately. After 2 minutes of CPR ultrasound showed initially no cardiac activity but this will be started during ultrasound and patient had return of pulses. Patient had 1 mg of epinephrine just prior to this. I discussed the patient's care with his wife and she requests that further resuscitation be performed. Patient was subsequently intubated with an 80 endotracheal tube via a glide scope. Patient's initial i-STAT reveals no gross abnormalities and initial troponin is 0.14. His EKG shows some ST depression in his lateral leads. Patient had decreasing blood pressures and was started on norepinephrine drip and now has  systolic blood pressures of approximately 120. He began to have some right-sided facial twitching and has had 1 mg of Ativan ordered and will be loaded with Dilantin. I have discussed his care with Dr.Alva on care for critical care medicine. Dr.Alva requested that cardiology be consulted and I have also spoken with Dr. Johney Frame. CRITICAL CARE Performed by: [name] Total critical care time: 90 Critical care time was exclusive of separately billable procedures and treating other patients. Critical care was necessary to treat or prevent imminent or life-threatening deterioration. Critical care was time spent personally by me on the following activities: development of treatment plan with patient and/or surrogate as well as nursing, discussions with consultants, evaluation of patient's response to treatment, examination of patient, obtaining history from patient or surrogate, ordering and performing treatments and interventions, ordering and review of laboratory studies, ordering and review of radiographic studies, pulse oximetry and re-evaluation of patient's condition.   [name], MD 08/16/2013 [phone]

## 2013-09-12 NOTE — ED Notes (Signed)
[facility], has dilantin and starting 3rd IV

## 2013-09-12 NOTE — ED Notes (Signed)
Positive for fermoral pulses, pt twitching, having seizure, MD at the bedside, orders given

## 2013-09-12 NOTE — Progress Notes (Signed)
Tremors continue with low b/p 36/17. Heart rate 134. Levophed restarted. Dr Delton Coombes notified Ativan given see MAR. Wife notified by niece to return to hospital

## 2013-09-12 NOTE — Progress Notes (Signed)
Orthopedic Tech Progress Note Patient Details:  Awab Abebe 22-Jun-1925 [id number]  Patient ID: Velora Mediate, male   DOB: [birth year], 79 y.o.   MRN: [id number]  Brace order completed by Storm Frisk, Lola Lofaro 09/10/2013, 3:53 PM

## 2013-09-12 NOTE — ED Notes (Signed)
attempted foley by Elpidio Galea, would not advance,

## 2013-09-12 NOTE — Progress Notes (Signed)
This nurse speaks to Dr. Delton Coombes in [facility]. MD informs this nurse with conversation with family to make patient a DNR, no escalation of care, and verbal order to titrate Levophed no more than 72mcg/min.

## 2013-09-12 NOTE — ED Notes (Signed)
[facility] leaving with pt, calling report to [facility]

## 2013-09-12 NOTE — Consult Note (Addendum)
NEURO HOSPITALIST CONSULT NOTE    Reason for Consult: Possible seizure versus myoclonus after unknown down time and cardiac arrest.   HPI:                                                                                                                                          Majority of history obtained from chart  Douglas Joseph is an 79 y.o. male PMH of CAD s/p pacemaker, HTN, HLD, COPD, GERD, BPH and hx of cervical fusion who presented to [facility] after falling out of bed around 2 am. Patients wife asked if he was ok and per wife he said he was fine.  He laid on the floor from 2 AM to 8 AM --unknown if patient was having difficulty breathing or abnormal cardiac rhythm between that time.  EMD Around 8 am EMS arrived and took him to [facility]. He was unresponsive in transit to hospital. When he arrived to the hospital he was in found to be in PEA. He was revived after with 5 minutes of CPR and 1 dose Epi.  While in hospital he has been noted to have reif episodes of abnormal UE jerking movements that are related to external stimulation including tactile and loud noise. Episodes last for only a few seconds and quickly stop.  Eyes clench tight, left arm flexes with brief clonic activity.    After initial consultation patient wife was in room.  She sates she heard a "boom" found him on the floor at "around 2 AM".  She placed a pillow under his head due to patient stating "I will be fine".  At around 8 am she checked on him and noted he was not getting up. This made her worried and she called EMS.   Past Medical History  Diagnosis Date  . Prostate enlargement   . Hypertension   . Coronary artery disease   . Cardiac pacemaker in situ   . Other and unspecified hyperlipidemia   . Chronic airway obstruction, not elsewhere classified   . Urinary frequency   . Backache, unspecified   . Esophageal reflux   . Chronic kidney disease, stage III (moderate)   . Unspecified constipation   .  Hypertrophy of prostate without urinary obstruction and other lower urinary tract symptoms (LUTS)   . Osteoarthrosis, unspecified whether generalized or localized, unspecified site   . Unspecified asthma(493.90)   . Anxiety state, unspecified   . Allergic rhinitis, cause unspecified   . Atrioventricular block, complete     Past Surgical History  Procedure Laterality Date  . Pacemaker insertion      10 yrs ago  . Cervical fusion      cervical neck  . Inguinal hernia repair  11/24/2011    Procedure: HERNIA REPAIR INGUINAL  ADULT;  Surgeon: Marlane Hatcher, MD;  Location: [facility];  Service: General;  Laterality: Left;    Family History  Problem Relation Age of Onset  . Lung cancer Daughter   . Anesthesia problems Neg Hx   . Hypotension Neg Hx   . Malignant hyperthermia Neg Hx   . Pseudochol deficiency Neg Hx      Social History:  reports that he quit smoking about 54 years ago. His smoking use included Cigarettes. He has a 40 pack-year smoking history. He does not have any smokeless tobacco history on file. He reports that he does not drink alcohol or use illicit drugs.  No Known Allergies  MEDICATIONS:                                                                                                                     Scheduled: . aspirin  324 mg Oral NOW   Or  . aspirin  300 mg Rectal NOW  . etomidate      . heparin  5,000 Units Subcutaneous Q8H  . levETIRAcetam  1,000 mg Intravenous Once  . levETIRAcetam  500 mg Intravenous Q12H  . lidocaine (cardiac) 100 mg/6ml      . LORazepam      . LORazepam      . pantoprazole (PROTONIX) IV  40 mg Intravenous QHS  . rocuronium      . sodium bicarbonate       Continuous: . dextrose 5 % and 0.9% NaCl    . norepinephrine (LEVOPHED) Adult infusion Stopped (09/11/2013 1230)  .  sodium bicarbonate  infusion 1000 mL 50 mL/hr at 08/27/2013 1230   ZOX:WRUEAV chloride, fentaNYL, LORazepam   ROS:                                                                                                                                        History obtained from unobtainable from patient due to mental status  Dermatological ROS: negative for rash and skin lesion changes   Blood pressure 142/114, pulse 87, temperature 97.1 F (36.2 C), temperature source Rectal, resp. rate 29, SpO2 100.00%.   Neurologic Examination:  Mental Status: Patient does not respond to verbal stimuli.  Does not respond to deep sternal rub.  Does not follow commands.  No verbalizations noted.  Cranial Nerves: II: patient does not respond confrontation bilaterally, pupils right 4 mm, left 3 mm,and reactive bilaterally III,IV,VI: doll's response not able to illicit due to C7 fracture. Disconjugate at rest with intermittent upward nystagmus. V,VII: corneal reflex present bilaterally --when stimulated he shows myoclonic activity bilateral eyes VIII: myoclonic activity noted with loud clapping sounds next to his ear  IX,X: gag reflex absent,  XI: trapezius strength unable to test bilaterally XII: tongue strength unable to test Motor: Extremities flaccid throughout.  No spontaneous movement noted.  When eyes are touched or loud stimuli given he shows brief episodes of myoclonic jerking of face and left UE/LE but no right UE/LE extremity. .  Sensory: Does not respond to noxious stimuli in any extremity . Deep Tendon Reflexes:  Absent throughout. Plantars: equivocal bilaterally Cerebellar: Unable to perform    Lab Results  Component Value Date/Time   CHOL 124 06/06/2009  8:50 PM    Results for orders placed during the hospital encounter of 08/26/2013 (from the past 48 hour(s))  POCT I-STAT TROPONIN I     Status: Abnormal   Collection Time    09/02/2013  9:11 AM      Result Value Range   Troponin i, poc 0.14 (*) 0.00 - 0.08 ng/mL   Comment NOTIFIED PHYSICIAN      Comment 3            Comment: Due to the release kinetics of cTnI,     a negative result within the first hours     of the onset of symptoms does not rule out     myocardial infarction with certainty.     If myocardial infarction is still suspected,     repeat the test at appropriate intervals.  POCT I-STAT, CHEM 8     Status: Abnormal   Collection Time    09/10/2013  9:12 AM      Result Value Range   Sodium 139  135 - 145 mEq/L   Potassium 4.7  3.5 - 5.1 mEq/L   Chloride 104  96 - 112 mEq/L   BUN 27 (*) 6 - 23 mg/dL   Creatinine, Ser 1.61 (*) 0.50 - 1.35 mg/dL   Glucose, Bld 096 (*) 70 - 99 mg/dL   Calcium, Ion 0.45  4.09 - 1.30 mmol/L   TCO2 21  0 - 100 mmol/L   Hemoglobin 13.9  13.0 - 17.0 g/dL   HCT 81.1  91.4 - 78.2 %  BLOOD GAS, ARTERIAL     Status: Abnormal   Collection Time    08/27/2013  9:28 AM      Result Value Range   FIO2 100.00     Delivery systems VENTILATOR     Mode PRESSURE REGULATED VOLUME CONTROL     VT 0.500     Rate 18.0     Peep/cpap 5.0     pH, Arterial 7.162 (*) 7.350 - 7.450   Comment: CRITICAL RESULT CALLED TO, READ BACK BY AND VERIFIED WITH:     DR.RAY,MD AT 0920,BY KIM GROENDAL RRT,RCP ON 09/12/2013   pCO2 arterial 44.9  35.0 - 45.0 mmHg   pO2, Arterial 210.0 (*) 80.0 - 100.0 mmHg   Bicarbonate 15.5 (*) 20.0 - 24.0 mEq/L   TCO2 15.1  0 - 100 mmol/L   Acid-base deficit 11.6 (*) 0.0 - 2.0  mmol/L   O2 Saturation 98.7     Patient temperature 37.0     Collection site LEFT BRACHIAL     Drawn by [phone]     Sample type ARTERIAL DRAW     Allens test (pass/fail) PASS  PASS  POCT I-STAT 3, BLOOD GAS (G3+)     Status: Abnormal   Collection Time    09/02/2013 12:28 PM      Result Value Range   pH, Arterial 7.344 (*) 7.350 - 7.450   pCO2 arterial 40.7  35.0 - 45.0 mmHg   pO2, Arterial 462.0 (*) 80.0 - 100.0 mmHg   Bicarbonate 22.2  20.0 - 24.0 mEq/L   TCO2 23  0 - 100 mmol/L   O2 Saturation 100.0     Acid-base deficit 3.0 (*) 0.0 - 2.0 mmol/L   Patient  temperature 98.6 F     Collection site RADIAL, ALLEN'S TEST ACCEPTABLE     Drawn by RT     Sample type ARTERIAL      Ct Head Wo Contrast  08/16/2013   ADDENDUM REPORT: 09/02/2013 11:22  ADDENDUM: Hypodensity right cerebellum (series 3, image 7) probably related to streak artifact rather than acute infarct. With the cervical spine injury, result of vertebral artery dissection and infarct not entirely excluded as cause for this appearance but felt to be less likely consideration.   Electronically Signed   By: Bridgett Larsson M.D.   On: 08/19/2013 11:22   08/30/2013   CLINICAL DATA:  Fall. Hypertension. Pacemaker. Cardiac arrest. Seizure.  EXAM: CT HEAD WITHOUT CONTRAST  CT CERVICAL SPINE WITHOUT CONTRAST  TECHNIQUE: Multidetector CT imaging of the head and cervical spine was performed following the standard protocol without intravenous contrast. Multiplanar CT image reconstructions of the cervical spine were also generated.  COMPARISON:  None.  FINDINGS: CT HEAD FINDINGS  Mild soft tissue swelling right parietal region. No underlying fracture or intracranial hemorrhage.  Small vessel disease type changes without CT evidence of large acute infarct.  Atrophy without hydrocephalus.  No intracranial mass lesion noted on this unenhanced exam.  Vascular calcifications.  Mucosal thickening maxillary sinuses with evidence of prior sinus surgery. Minimal mucosal thickening ethmoid sinus air cells.  CT CERVICAL SPINE FINDINGS  Complex fracture of the C7 vertebral body with sagittal and oblique component extending from the mid aspect of the vertebral body into the left C7 pedicle which is fractured. Anterior superior C7 vertebral fracture.  Fracture of the right C6 transverse process with fracture fragments displaced anteriorly. This fracture extends through the course of the right vertebral artery which may be injury.  Prior laminectomy from C3 through T1.  Transverse ligament hypertrophy. Erosion of the adjacent dens.  C2-3 disc protrusion. Spinal stenosis with cord compression.  Fusion of C3 through C5.  Cervical spondylotic changes C3 through T1 with foraminal narrowing and impression upon the ventral aspect of the cord which is posteriorly displaced.  Fusion of C7 and T1 posterior elements as well as left C6-7 posterior elements. Widening of the right C6-7 facet joint which may be related to the patient's recent fall and/or degenerative changes.  IMPRESSION: Head CT:  Mild soft tissue swelling right parietal region. No underlying fracture or intracranial hemorrhage.  Small vessel disease type changes without CT evidence of large acute infarct.  Atrophy without hydrocephalus.  Cervical spine CT:  Complex fracture of the C7 vertebral body with sagittal and oblique component extending from the mid aspect of the vertebral body into the left C7 pedicle  which is fractured. Anterior superior C7 vertebral fracture.  Fracture of the right C6 transverse process with fracture fragments displaced anteriorly. This fracture extends through the course of the right vertebral artery which may be injury.  Prior laminectomy from C3 through T1.  Transverse ligament hypertrophy. Erosion of the adjacent dens. C2-3 disc protrusion. Spinal stenosis with cord compression.  Fusion of C3 through C5.  Cervical spondylotic changes C3 through T1 with foraminal narrowing and impression upon the ventral aspect of the cord which is posteriorly displaced.  Fusion of C7 and T1 posterior elements as well as left C6-7 posterior elements. Widening of the right C6-7 facet joint which may be related to the patient's recent fall and/or degenerative changes.  These results were called by telephone at the time of interpretation on 09-26-13 at 10:45 AM to Dr. Margarita Grizzle , who verbally acknowledged these results.  Electronically Signed: By: Bridgett Larsson M.D. On: 09-26-2013 11:04   Ct Cervical Spine Wo Contrast  09/26/2013   ADDENDUM REPORT: 26-Sep-2013 11:22   ADDENDUM: Hypodensity right cerebellum (series 3, image 7) probably related to streak artifact rather than acute infarct. With the cervical spine injury, result of vertebral artery dissection and infarct not entirely excluded as cause for this appearance but felt to be less likely consideration.   Electronically Signed   By: Bridgett Larsson M.D.   On: 2013/09/26 11:22   2013-09-26   CLINICAL DATA:  Fall. Hypertension. Pacemaker. Cardiac arrest. Seizure.  EXAM: CT HEAD WITHOUT CONTRAST  CT CERVICAL SPINE WITHOUT CONTRAST  TECHNIQUE: Multidetector CT imaging of the head and cervical spine was performed following the standard protocol without intravenous contrast. Multiplanar CT image reconstructions of the cervical spine were also generated.  COMPARISON:  None.  FINDINGS: CT HEAD FINDINGS  Mild soft tissue swelling right parietal region. No underlying fracture or intracranial hemorrhage.  Small vessel disease type changes without CT evidence of large acute infarct.  Atrophy without hydrocephalus.  No intracranial mass lesion noted on this unenhanced exam.  Vascular calcifications.  Mucosal thickening maxillary sinuses with evidence of prior sinus surgery. Minimal mucosal thickening ethmoid sinus air cells.  CT CERVICAL SPINE FINDINGS  Complex fracture of the C7 vertebral body with sagittal and oblique component extending from the mid aspect of the vertebral body into the left C7 pedicle which is fractured. Anterior superior C7 vertebral fracture.  Fracture of the right C6 transverse process with fracture fragments displaced anteriorly. This fracture extends through the course of the right vertebral artery which may be injury.  Prior laminectomy from C3 through T1.  Transverse ligament hypertrophy. Erosion of the adjacent dens. C2-3 disc protrusion. Spinal stenosis with cord compression.  Fusion of C3 through C5.  Cervical spondylotic changes C3 through T1 with foraminal narrowing and impression upon the ventral aspect  of the cord which is posteriorly displaced.  Fusion of C7 and T1 posterior elements as well as left C6-7 posterior elements. Widening of the right C6-7 facet joint which may be related to the patient's recent fall and/or degenerative changes.  IMPRESSION: Head CT:  Mild soft tissue swelling right parietal region. No underlying fracture or intracranial hemorrhage.  Small vessel disease type changes without CT evidence of large acute infarct.  Atrophy without hydrocephalus.  Cervical spine CT:  Complex fracture of the C7 vertebral body with sagittal and oblique component extending from the mid aspect of the vertebral body into the left C7 pedicle which is fractured. Anterior superior C7 vertebral fracture.  Fracture of the  right C6 transverse process with fracture fragments displaced anteriorly. This fracture extends through the course of the right vertebral artery which may be injury.  Prior laminectomy from C3 through T1.  Transverse ligament hypertrophy. Erosion of the adjacent dens. C2-3 disc protrusion. Spinal stenosis with cord compression.  Fusion of C3 through C5.  Cervical spondylotic changes C3 through T1 with foraminal narrowing and impression upon the ventral aspect of the cord which is posteriorly displaced.  Fusion of C7 and T1 posterior elements as well as left C6-7 posterior elements. Widening of the right C6-7 facet joint which may be related to the patient's recent fall and/or degenerative changes.  These results were called by telephone at the time of interpretation on 08/30/2013 at 10:45 AM to Dr. Margarita Grizzle , who verbally acknowledged these results.  Electronically Signed: By: Bridgett Larsson M.D. On: 08/29/2013 11:04   Dg Chest Portable 1 View  08/26/2013   CLINICAL DATA:  Cardiac arrest.  EXAM: PORTABLE CHEST - 1 VIEW  COMPARISON:  02/05/2012.  FINDINGS: Endotracheal tube tip 3.7 cm above the carina. Sequential pacemaker is in place with leads unchanged in position. Heart size within normal  limits.  Pulmonary vascular prominence most notable centrally. Increased markings lung bases may represent pulmonary vascular prominence combined with chronic lung changes although basilar infiltrate difficult to exclude. This can be assessed on follow up.  Tortuous aorta.  No gross pneumothorax.  Lead overlies the proximal descending thoracic aorta.  IMPRESSION: Endotracheal tube tip 3.7 cm above the carina. Sequential pacemaker is in place with leads unchanged in position. Heart size within normal limits.  Pulmonary vascular prominence most notable centrally. Increased markings lung bases may represent pulmonary vascular prominence combined with chronic lung changes although basilar infiltrate difficult to exclude. This can be assessed on follow up.  Tortuous aorta.  Lead overlies the proximal descending thoracic aorta.   Electronically Signed   By: Bridgett Larsson M.D.   On: 08/20/2013 09:48    Assessment and plan per attending neurologist  Felicie Morn PA-C [facility] [phone]  08/24/2013, 12:37 PM   Assessment/Plan:  79 year old male with likely devastating anoxic brain injury. Given that my evaluation was done only 5 hours after the arrest, including EEG, I am hesitant to make an absolute declaration at this point. I would favor repeating an EEG and evaluation in the morning, but likely he has had a devastating injury. The stimulus-induced generalized myoclonus to be subcortically driven, with the artifact from muscle there could be discharges there, but either way I would treat him conservatively.   I discussed my suspicion for devastating injury with his wife and asked for people to be available tomorrow to make a decision.  1) repeat EEG in the morning 2) continue keppra 500mg  BID  Ritta Slot, MD [facility] [phone]  If 7pm- 7am, please page neurology on call at [phone].

## 2013-09-12 NOTE — Progress Notes (Signed)
eLink Physician-Brief Progress Note Patient Name: Douglas Joseph DOB: 1924-10-31 MRN: [id number]  Date of Service  2013/09/16   HPI/Events of Note  Spoke with pt's wife and son by phone to review status and his EEG results. Explained his myoclonus and his anoxic brain injury. I have advocated no escalation of his care and his family agrees. They understand that he may pass away on MV + norepi, but that if not we may need to broach subject of a formal withdrawal of care in coming days. In meantime I will make pt no CPR, no escalation of care.     eICU Interventions     Intervention Category Major Interventions: End of life / care limitation discussion  BYRUM,Amauri S. Sep 16, 2013, 8:06 PM

## 2013-09-12 NOTE — Care Management Note (Signed)
    Page 1 of 1   09/03/2013     12:16:20 PM   CARE MANAGEMENT NOTE 09/05/2013  Patient:  Douglas Joseph,Douglas Joseph   Account Number:  [id number]  Date Initiated:  08/22/2013  Documentation initiated by:  Junius Creamer  Subjective/Objective Assessment:   adm w cardiac arrest, vent     Action/Plan:   lives w wife, pcp dr ed Juanetta Gosling   Anticipated DC Date:     Anticipated DC Plan:           Choice offered to / List presented to:             Status of service:   Medicare Important Message given?   (If response is "NO", the following Medicare IM given date fields will be blank) Date Medicare IM given:   Date Additional Medicare IM given:    Discharge Disposition:    Per UR Regulation:  Reviewed for med. necessity/level of care/duration of stay  If discussed at Long Length of Stay Meetings, dates discussed:    Comments:

## 2013-09-12 NOTE — Procedures (Signed)
Central Venous Catheter Insertion Procedure Note Douglas Joseph [id number] 12/10/1924  Procedure: Insertion of Central Venous Catheter Indications: Assessment of intravascular volume, Drug and/or fluid administration and Frequent blood sampling  Procedure Details Consent: Risks of procedure as well as the alternatives and risks of each were explained to the (patient/caregiver).  Consent for procedure obtained. Time Out: Verified patient identification, verified procedure, site/side was marked, verified correct patient position, special equipment/implants available, medications/allergies/relevent history reviewed, required imaging and test results available.  Performed  Maximum sterile technique was used including antiseptics, cap, gloves, gown, hand hygiene, mask and sheet. Skin prep: Chlorhexidine; local anesthetic administered A antimicrobial bonded/coated triple lumen catheter was placed in the right subclavian vein using the Seldinger technique. Ultrasound guidance used.yes Catheter placed to 16 cm. Blood aspirated via all 3 ports and then flushed x 3. Line sutured x 2 and dressing applied.  Evaluation Blood flow good Complications: No apparent complications Patient did tolerate procedure well. Chest X-ray ordered to verify placement.  CXR: pending.  Brett Canales Minor ACNP [facility] Pollack PCCM Pager [phone] till 3 pm If no answer page [phone]  Ultrasound used for site verification, live visualisation of needle entry & guidewire prior to dilation CXR shows CVL coursing in to [facility] - will accept this for now, since off pressors & use CVL for blood draws, simple infusions only  ALVA,RAKESH V.  2013/09/24, 12:15 PM

## 2013-09-12 NOTE — ED Notes (Signed)
Pt fell last night at home, around 2am, family allowed the pt. To lay  In the floor (per ems), pt was alert at arrival by ems then, became unrepsonsive, in cardiac arrest at arrival.

## 2013-09-12 NOTE — ED Notes (Signed)
intubation in process

## 2013-09-12 NOTE — ED Notes (Signed)
Attempted NG tube, with RT would not advance tiffany Rn

## 2013-09-12 NOTE — ED Notes (Signed)
EMS arrived with pt, Per wife pt fell this morning,  went unresponsive in route. Pt not breathing, no pulse, MD at the bedside, CPR started, pt on monitor, second iv started.

## 2013-09-12 NOTE — H&P (Signed)
Name: Douglas Joseph MRN: [id number] DOB: 1925-04-25    ADMISSION DATE:  08/29/2013  REFERRING MD :  Dr. Rosalia Hammers - EDP PRIMARY SERVICE: PCCM  CHIEF COMPLAINT:  Cardiac Arrest  BRIEF PATIENT DESCRIPTION: 79 y/o M admitted 12/29 post fall at home with cardiac arrest in ER (<5 min CPR).  CT Neck with C7 Cervical fx.  Tx to [facility].   SIGNIFICANT EVENTS / STUDIES:  12/29 - admit post fall at home with C7 cervical fx, cardiac arrest in ER requiring brief CPR (asystole)  LINES / TUBES: OETT 12/29>>> R [place] TLC 12/29>>>  CULTURES: Sputum 12/29>>>  ANTIBIOTICS:   HISTORY OF PRESENT ILLNESS:  79 y/o M with PMH of CAD s/p pacemaker, HTN, HLD, COPD, GERD, BPH and hx of cervical fusion who presented to [facility] after falling out of bed around 2 am.  Wife reports pt stated "he was okay" and she allowed him to lay on the floor.  When she woke this am, he was still on the floor and awake but could not get up.  She activated EMS and in route he became less responsive.  On arrival to ER, he was noted to have loss of pulse and required approx 5 minutes of CPR and epi x1 with return of spontaneous circulation.  Post arrest, he was observed to have seizure like activity that stopped with 1mg  of ativan.  Pt was intubated in ER.   CT of Head demonstrated no acute bleed but noted a C7 cervical fracture. Pt treated with levophed for hypotension and bicarb gtt for acidosis.  Transferred to [facility] for further care.   PAST MEDICAL HISTORY :  Past Medical History  Diagnosis Date  . Prostate enlargement   . Hypertension   . Coronary artery disease   . Cardiac pacemaker in situ   . Other and unspecified hyperlipidemia   . Chronic airway obstruction, not elsewhere classified   . Urinary frequency   . Backache, unspecified   . Esophageal reflux   . Chronic kidney disease, stage III (moderate)   . Unspecified constipation   . Hypertrophy of prostate without urinary obstruction and other lower urinary tract symptoms (LUTS)    . Osteoarthrosis, unspecified whether generalized or localized, unspecified site   . Unspecified asthma(493.90)   . Anxiety state, unspecified   . Allergic rhinitis, cause unspecified   . Atrioventricular block, complete    Past Surgical History  Procedure Laterality Date  . Pacemaker insertion      10 yrs ago  . Cervical fusion      cervical neck  . Inguinal hernia repair  11/24/2011    Procedure: HERNIA REPAIR INGUINAL ADULT;  Surgeon: Marlane Hatcher, MD;  Location: [facility]S;  Service: General;  Laterality: Left;   Prior to Admission medications   Medication Sig Start Date End Date Taking? Authorizing Provider  citalopram (CELEXA) 20 MG tablet Take 20 mg by mouth every morning.     Historical Provider, MD  diltiazem (CARDIZEM CD) 180 MG 24 hr capsule Take 180 mg by mouth every morning.     Historical Provider, MD  dutasteride (AVODART) 0.5 MG capsule Take 0.5 mg by mouth every morning.     Historical Provider, MD  meclizine (ANTIVERT) 25 MG tablet Take 25 mg by mouth 3 (three) times daily as needed.    Historical Provider, MD  omeprazole (PRILOSEC) 20 MG capsule Take 20 mg by mouth every morning. For acid reflux    Historical Provider, MD  senna-docusate (SENOKOT-S)  8.6-50 MG per tablet Take 1 tablet by mouth at bedtime.     Historical Provider, MD  Tamsulosin HCl (FLOMAX) 0.4 MG CAPS Take 0.4 mg by mouth daily.     Historical Provider, MD  tiotropium (SPIRIVA) 18 MCG inhalation capsule Place 18 mcg into inhaler and inhale every morning.    Historical Provider, MD  traMADol-acetaminophen (ULTRACET) 37.5-325 MG per tablet Take 1 tablet by mouth 2 (two) times daily.      Historical Provider, MD   No Known Allergies  FAMILY HISTORY:  Family History  Problem Relation Age of Onset  . Lung cancer Daughter   . Anesthesia problems Neg Hx   . Hypotension Neg Hx   . Malignant hyperthermia Neg Hx   . Pseudochol deficiency Neg Hx    SOCIAL HISTORY:  reports that he quit smoking about  54 years ago. His smoking use included Cigarettes. He has a 40 pack-year smoking history. He does not have any smokeless tobacco history on file. He reports that he does not drink alcohol or use illicit drugs.  REVIEW OF SYSTEMS:  Unable to complete as pt is altered  SUBJECTIVE:   VITAL SIGNS: Temp:  [97.1 F (36.2 C)] 97.1 F (36.2 C) (12/29 0955) Pulse Rate:  [88-120] 88 (12/29 1145) Resp:  [0-24] 24 (12/29 1145) BP: (0-190)/(0-90) 127/80 mmHg (12/29 1145) SpO2:  [98 %-100 %] 100 % (12/29 1145) FiO2 (%):  [100 %] 100 % (12/29 1140)  HEMODYNAMICS:    VENTILATOR SETTINGS: Vent Mode:  [-] PRVC FiO2 (%):  [100 %] 100 % Set Rate:  [18 bmp] 18 bmp Vt Set:  [500 mL] 500 mL PEEP:  [5 cmH20] 5 cmH20 Plateau Pressure:  [13 cmH20-15 cmH20] 15 cmH20  INTAKE / OUTPUT: Intake/Output     12/28 0701 - 12/29 0700 12/29 0701 - 12/30 0700   I.V.  191.3   Total Intake   191.3   Net   +191.3          PHYSICAL EXAMINATION: General:  Frail elderly male in NAD on vent Neuro:  Obtunded, intermittent jerking movements of ext's, pupils 3-36mm HEENT:  OETT, mm pink/moist Cardiovascular:  s1s2 rrr, no m/r/g Lungs:  resp's even/non-labored, lungs bilaterally coarse Abdomen:  Flat, soft, bsx4 active Musculoskeletal:  No acute deformities, C-Collar in place Skin:  Warm/dry, abrasions on LE's from fall / lying  LABS:  CBC  Recent Labs Lab 08/16/2013 0912  HGB 13.9  HCT 41.0   Coag's No results found for this basename: APTT, INR,  in the last 168 hours BMET  Recent Labs Lab 08/15/2013 0912  NA 139  K 4.7  CL 104  BUN 27*  CREATININE 1.60*  GLUCOSE 190*   Electrolytes No results found for this basename: CALCIUM, MG, PHOS,  in the last 168 hours Sepsis Markers No results found for this basename: LATICACIDVEN, PROCALCITON, O2SATVEN,  in the last 168 hours ABG  Recent Labs Lab 08/23/2013 0928  PHART 7.162*  PCO2ART 44.9  PO2ART 210.0*   Liver Enzymes No results found for this  basename: AST, ALT, ALKPHOS, BILITOT, ALBUMIN,  in the last 168 hours Cardiac Enzymes No results found for this basename: TROPONINI, PROBNP,  in the last 168 hours Glucose No results found for this basename: GLUCAP,  in the last 168 hours  Imaging Ct Head Wo Contrast  08/20/2013   ADDENDUM REPORT: 09/03/2013 11:22  ADDENDUM: Hypodensity right cerebellum (series 3, image 7) probably related to streak artifact rather than acute infarct. With  the cervical spine injury, result of vertebral artery dissection and infarct not entirely excluded as cause for this appearance but felt to be less likely consideration.   Electronically Signed   By: Bridgett Larsson M.D.   On: 09/03/2013 11:22   09/11/2013   CLINICAL DATA:  Fall. Hypertension. Pacemaker. Cardiac arrest. Seizure.  EXAM: CT HEAD WITHOUT CONTRAST  CT CERVICAL SPINE WITHOUT CONTRAST  TECHNIQUE: Multidetector CT imaging of the head and cervical spine was performed following the standard protocol without intravenous contrast. Multiplanar CT image reconstructions of the cervical spine were also generated.  COMPARISON:  None.  FINDINGS: CT HEAD FINDINGS  Mild soft tissue swelling right parietal region. No underlying fracture or intracranial hemorrhage.  Small vessel disease type changes without CT evidence of large acute infarct.  Atrophy without hydrocephalus.  No intracranial mass lesion noted on this unenhanced exam.  Vascular calcifications.  Mucosal thickening maxillary sinuses with evidence of prior sinus surgery. Minimal mucosal thickening ethmoid sinus air cells.  CT CERVICAL SPINE FINDINGS  Complex fracture of the C7 vertebral body with sagittal and oblique component extending from the mid aspect of the vertebral body into the left C7 pedicle which is fractured. Anterior superior C7 vertebral fracture.  Fracture of the right C6 transverse process with fracture fragments displaced anteriorly. This fracture extends through the course of the right vertebral  artery which may be injury.  Prior laminectomy from C3 through T1.  Transverse ligament hypertrophy. Erosion of the adjacent dens. C2-3 disc protrusion. Spinal stenosis with cord compression.  Fusion of C3 through C5.  Cervical spondylotic changes C3 through T1 with foraminal narrowing and impression upon the ventral aspect of the cord which is posteriorly displaced.  Fusion of C7 and T1 posterior elements as well as left C6-7 posterior elements. Widening of the right C6-7 facet joint which may be related to the patient's recent fall and/or degenerative changes.  IMPRESSION: Head CT:  Mild soft tissue swelling right parietal region. No underlying fracture or intracranial hemorrhage.  Small vessel disease type changes without CT evidence of large acute infarct.  Atrophy without hydrocephalus.  Cervical spine CT:  Complex fracture of the C7 vertebral body with sagittal and oblique component extending from the mid aspect of the vertebral body into the left C7 pedicle which is fractured. Anterior superior C7 vertebral fracture.  Fracture of the right C6 transverse process with fracture fragments displaced anteriorly. This fracture extends through the course of the right vertebral artery which may be injury.  Prior laminectomy from C3 through T1.  Transverse ligament hypertrophy. Erosion of the adjacent dens. C2-3 disc protrusion. Spinal stenosis with cord compression.  Fusion of C3 through C5.  Cervical spondylotic changes C3 through T1 with foraminal narrowing and impression upon the ventral aspect of the cord which is posteriorly displaced.  Fusion of C7 and T1 posterior elements as well as left C6-7 posterior elements. Widening of the right C6-7 facet joint which may be related to the patient's recent fall and/or degenerative changes.  These results were called by telephone at the time of interpretation on 09/09/2013 at 10:45 AM to Dr. Margarita Grizzle , who verbally acknowledged these results.  Electronically Signed:  By: Bridgett Larsson M.D. On: 09/11/2013 11:04   Ct Cervical Spine Wo Contrast  09/08/2013   ADDENDUM REPORT: 09/12/2013 11:22  ADDENDUM: Hypodensity right cerebellum (series 3, image 7) probably related to streak artifact rather than acute infarct. With the cervical spine injury, result of vertebral artery dissection and infarct not  entirely excluded as cause for this appearance but felt to be less likely consideration.   Electronically Signed   By: Bridgett Larsson M.D.   On: 10/07/13 11:22   October 07, 2013   CLINICAL DATA:  Fall. Hypertension. Pacemaker. Cardiac arrest. Seizure.  EXAM: CT HEAD WITHOUT CONTRAST  CT CERVICAL SPINE WITHOUT CONTRAST  TECHNIQUE: Multidetector CT imaging of the head and cervical spine was performed following the standard protocol without intravenous contrast. Multiplanar CT image reconstructions of the cervical spine were also generated.  COMPARISON:  None.  FINDINGS: CT HEAD FINDINGS  Mild soft tissue swelling right parietal region. No underlying fracture or intracranial hemorrhage.  Small vessel disease type changes without CT evidence of large acute infarct.  Atrophy without hydrocephalus.  No intracranial mass lesion noted on this unenhanced exam.  Vascular calcifications.  Mucosal thickening maxillary sinuses with evidence of prior sinus surgery. Minimal mucosal thickening ethmoid sinus air cells.  CT CERVICAL SPINE FINDINGS  Complex fracture of the C7 vertebral body with sagittal and oblique component extending from the mid aspect of the vertebral body into the left C7 pedicle which is fractured. Anterior superior C7 vertebral fracture.  Fracture of the right C6 transverse process with fracture fragments displaced anteriorly. This fracture extends through the course of the right vertebral artery which may be injury.  Prior laminectomy from C3 through T1.  Transverse ligament hypertrophy. Erosion of the adjacent dens. C2-3 disc protrusion. Spinal stenosis with cord compression.   Fusion of C3 through C5.  Cervical spondylotic changes C3 through T1 with foraminal narrowing and impression upon the ventral aspect of the cord which is posteriorly displaced.  Fusion of C7 and T1 posterior elements as well as left C6-7 posterior elements. Widening of the right C6-7 facet joint which may be related to the patient's recent fall and/or degenerative changes.  IMPRESSION: Head CT:  Mild soft tissue swelling right parietal region. No underlying fracture or intracranial hemorrhage.  Small vessel disease type changes without CT evidence of large acute infarct.  Atrophy without hydrocephalus.  Cervical spine CT:  Complex fracture of the C7 vertebral body with sagittal and oblique component extending from the mid aspect of the vertebral body into the left C7 pedicle which is fractured. Anterior superior C7 vertebral fracture.  Fracture of the right C6 transverse process with fracture fragments displaced anteriorly. This fracture extends through the course of the right vertebral artery which may be injury.  Prior laminectomy from C3 through T1.  Transverse ligament hypertrophy. Erosion of the adjacent dens. C2-3 disc protrusion. Spinal stenosis with cord compression.  Fusion of C3 through C5.  Cervical spondylotic changes C3 through T1 with foraminal narrowing and impression upon the ventral aspect of the cord which is posteriorly displaced.  Fusion of C7 and T1 posterior elements as well as left C6-7 posterior elements. Widening of the right C6-7 facet joint which may be related to the patient's recent fall and/or degenerative changes.  These results were called by telephone at the time of interpretation on 10-07-13 at 10:45 AM to Dr. Margarita Grizzle , who verbally acknowledged these results.  Electronically Signed: By: Bridgett Larsson M.D. On: 10/07/13 11:04   Dg Chest Portable 1 View  10-07-2013   CLINICAL DATA:  Cardiac arrest.  EXAM: PORTABLE CHEST - 1 VIEW  COMPARISON:  02/05/2012.  FINDINGS:  Endotracheal tube tip 3.7 cm above the carina. Sequential pacemaker is in place with leads unchanged in position. Heart size within normal limits.  Pulmonary vascular prominence most notable  centrally. Increased markings lung bases may represent pulmonary vascular prominence combined with chronic lung changes although basilar infiltrate difficult to exclude. This can be assessed on follow up.  Tortuous aorta.  No gross pneumothorax.  Lead overlies the proximal descending thoracic aorta.  IMPRESSION: Endotracheal tube tip 3.7 cm above the carina. Sequential pacemaker is in place with leads unchanged in position. Heart size within normal limits.  Pulmonary vascular prominence most notable centrally. Increased markings lung bases may represent pulmonary vascular prominence combined with chronic lung changes although basilar infiltrate difficult to exclude. This can be assessed on follow up.  Tortuous aorta.  Lead overlies the proximal descending thoracic aorta.   Electronically Signed   By: Bridgett Larsson M.D.   On: September 29, 2013 09:48    ASSESSMENT / PLAN:  PULMONARY A: Acute Respiratory Failure - in setting of cardiac arrest P:   -full vent support, 8 cc / kg -trend ABG -follow CXR  CARDIOVASCULAR A:  Cardiac Arrest - initial rhythm asystole, < 5 min CPR CAD s/p Pacemaker Hypotension Aflutter -fibn on PPM interrogation - 9 seconds of NSVT but doubt as cause of arrest ST depression and elevated troponin - likely sequela of his arrest and not a primary event   P:  -not cooling candidate with short arrest period -Cardiology Consult -consider pacemaker assessment -ASA -assess enzymes -levophed gtt for MAP >65  RENAL A:   Acute Kidney Injury - likely component rhabo given prolonged period of lying on floor Metabolic Acidosis  P:   -hydration -bicarb gtt at 75 -dc once pH improves > 7.30 -assess CMP now, lactate, CK -assess BMP 2000  GASTROINTESTINAL A:   GERD P:   -PPI -consider  early feeding -NPO  HEMATOLOGIC A:   SUP P:  -heparin SQ -assess CBC  INFECTIOUS A:   R/O Aspiration - in setting of AMS & unknown period of downtime P:   -sputum culture -follow fever curve / leukocytosis  ENDOCRINE A:   At Risk Hyper / Hypoglycemia    P:   -CBG's Q4 while NPO  NEUROLOGIC A:   R/O Seizure vs Myoclonus - prolonged period on floor after fall with C7 fx Acute Encephalopathy C7 Cervical Fx Hx of Cervical Fusion with cervical stenosis P:   -PRN ativan -keppra load, dosing per pharmacy -Neurology & NSGY consult -not a candidate for aggressive interventions -assess EEG -minimize sedation as able -cervical collar for stabilization  GOALS of CARE P: -ongoing discussion with wife, currently, she wants to continue full support.    [name], NP-C [facility] Pgr: [phone] or [phone]    I have personally obtained a history, examined the patient, evaluated laboratory and imaging results, formulated the assessment and plan and placed orders.  CRITICAL CARE: The patient is critically ill with multiple organ systems failure and requires high complexity decision making for assessment and support, frequent evaluation and titration of therapies, application of advanced monitoring technologies and extensive interpretation of multiple databases. Critical Care Time devoted to patient care services described in this note is 60 minutes.   Sekai Gitlin V.   09-29-13, 12:10 PM

## 2013-09-12 NOTE — Consult Note (Signed)
CC:  Chief Complaint  Patient presents with  . Cardiac Arrest    HPI: Douglas Joseph is a 79 y.o. male with history obtained from the EMR. The patient apparently fell from bed and was reportedly awake and alert when seen by his wife, however several hours later was unable to get off the floor and EMS was called. En route to the hospital he became unresponsive requiring ACLS code with ROSC after a few minutes.   CT scan of the head and neck demonstrated C7 fracture prompting neurosurgical consultation.  PMH: Past Medical History  Diagnosis Date  . Prostate enlargement   . Hypertension   . Coronary artery disease   . Cardiac pacemaker in situ   . Other and unspecified hyperlipidemia   . Chronic airway obstruction, not elsewhere classified   . Urinary frequency   . Backache, unspecified   . Esophageal reflux   . Chronic kidney disease, stage III (moderate)   . Unspecified constipation   . Hypertrophy of prostate without urinary obstruction and other lower urinary tract symptoms (LUTS)   . Osteoarthrosis, unspecified whether generalized or localized, unspecified site   . Unspecified asthma(493.90)   . Anxiety state, unspecified   . Allergic rhinitis, cause unspecified   . Atrioventricular block, complete   . PEA (Pulseless electrical activity)     PSH: Past Surgical History  Procedure Laterality Date  . Pacemaker insertion      10 yrs ago  . Cervical fusion      cervical neck  . Inguinal hernia repair  11/24/2011    Procedure: HERNIA REPAIR INGUINAL ADULT;  Surgeon: Marlane Hatcher, MD;  Location: [facility];  Service: General;  Laterality: Left;    SH: History  Substance Use Topics  . Smoking status: Former Smoker -- 2.00 packs/day for 20 years    Types: Cigarettes    Quit date: 11/21/1958  . Smokeless tobacco: Not on file  . Alcohol Use: No    MEDS: Prior to Admission medications   Medication Sig Start Date End Date Taking? Authorizing Provider  citalopram  (CELEXA) 20 MG tablet Take 20 mg by mouth every morning.     Historical Provider, MD  diltiazem (CARDIZEM CD) 180 MG 24 hr capsule Take 180 mg by mouth every morning.     Historical Provider, MD  dutasteride (AVODART) 0.5 MG capsule Take 0.5 mg by mouth every morning.     Historical Provider, MD  meclizine (ANTIVERT) 25 MG tablet Take 25 mg by mouth 3 (three) times daily as needed.    Historical Provider, MD  omeprazole (PRILOSEC) 20 MG capsule Take 20 mg by mouth every morning. For acid reflux    Historical Provider, MD  senna-docusate (SENOKOT-S) 8.6-50 MG per tablet Take 1 tablet by mouth at bedtime.     Historical Provider, MD  Tamsulosin HCl (FLOMAX) 0.4 MG CAPS Take 0.4 mg by mouth daily.     Historical Provider, MD  tiotropium (SPIRIVA) 18 MCG inhalation capsule Place 18 mcg into inhaler and inhale every morning.    Historical Provider, MD  traMADol-acetaminophen (ULTRACET) 37.5-325 MG per tablet Take 1 tablet by mouth 2 (two) times daily.      Historical Provider, MD    ALLERGY: No Known Allergies  NEUROLOGIC EXAM: No spontaneous eye opening to voice Pupils 5mm, reactive to light OU (-) corneal reflex bilaterally (-) occulocephalic reflex (-) cough (-) gag No motor responses to central noxious stimuli  IMGAING: CT H CT C-spine demonstrates sagittal fracture of  C7 body and left C7 pars with widening of right C6-7 joint. There is prior cervico-thoracic laminectomy and autofusion of right C6-7 and left C7-T1.  IMPRESSION: - 79 y.o. male with minimal neurologic exam concerning for severe anoxic injury given history.  - C7 fracture is likely stable in light of underlying autofusion of the cervical spine  PLAN: - Apply Aspen cervical collar for immobilization. If patient makes a meaningful recovery, can f/u in outpatient clinic for f/u XR. - Cont with video-EEG monitoring

## 2013-09-12 NOTE — Progress Notes (Signed)
Prolonged EEG discontinued, per Dr. Amada Jupiter.

## 2013-09-12 NOTE — ED Notes (Signed)
First pass, glideoscope, good breathe sounds, 8mm tube, taped at 24 at the lip

## 2013-09-12 NOTE — Progress Notes (Signed)
CRITICAL VALUE ALERT  Critical value received:  CKMB 50.1, Troponin 0.42  Date of notification:  08/20/2013 Time of notification:  1923 Critical value read back:yes  Nurse who received alert:  Gillis Ends MD notified (1st page):  Dr. Delton Coombes (E-Link) Time of first page:  1926  Responding MD:  Dr. Delton Coombes Time MD responded:  [phone]

## 2013-09-12 NOTE — Procedures (Addendum)
History: 79 year old male with altered mental status following cardiac arrest  Sedation: None, a total of 2 mg of Ativan over the past 5 hours had been given.  Background: The predominant pattern during this EEG is electrocerebral silence. There are rare bursts of low voltage poorly formed theta activity, but otherwise there is completely suppressed. During episodes of generalized myoclonus, the EEG is obscured by artifact, but there is certainly no interictal discharge.  Photic stimulation: Physiologic driving is not performed  EEG Abnormalities: 1) severely suppressed EEG with rare low-voltage bursts(burst-sppression) 2) episodes of generalized myoclonus with EEG obscured by muscle artifact  Clinical Interpretation: This  EEG is indicative of profound cerebral dysfunction. The captured clinical myoclonus could be ictal, but no ongoing seizure activity was  not seen.   Ritta Slot, MD [facility] [phone]  If 7pm- 7am, please page neurology on call at [phone].

## 2013-09-12 NOTE — Procedures (Signed)
Central Venous Catheter Insertion Procedure Note Douglas Joseph [id number] 11-04-1924  Procedure: Insertion of Central Venous Catheter - Reinsertion of TLC due to malpositioning.   Indications: Assessment of intravascular volume, Drug and/or fluid administration and Frequent blood sampling  Procedure Details Consent: Risks of procedure as well as the alternatives and risks of each were explained to the (patient/caregiver).  Consent for procedure obtained.  Time Out: Verified patient identification, verified procedure, site/side was marked, verified correct patient position, special equipment/implants available, medications/allergies/relevent history reviewed, required imaging and test results available.  Performed  Maximum sterile technique was used including antiseptics, cap, gloves, gown, hand hygiene, mask and sheet. Skin prep: Chlorhexidine; local anesthetic administered A non-coated triple lumen catheter was placed in the right subclavian vein to 15 cm using the Seldinger technique.  Evaluation Blood flow good Complications: No apparent complications Patient did tolerate procedure well. Chest X-ray ordered to verify placement.  CXR: pending.  Procedure performed under direct supervision of Dr. Marin Shutter and with ultrasound guidance for real time vessel cannulation.     [name], NP-C [facility] Pgr: [phone] or [phone]   09/07/2013, 4:05 PM  Supervised and present through the entire procedure  Lonia Farber, MD Pulmonary and Critical Care Medicine [facility] Pager: [phone]

## 2013-09-12 NOTE — Code Documentation (Signed)
Pulses returned, stac

## 2013-09-12 NOTE — Consult Note (Signed)
CARDIOLOGY CONSULT NOTE   Patient ID: Douglas Joseph MRN: [id number] DOB/AGE: Jul 23, 1925 79 y.o.  Admit date: 10-12-13  Primary Physician   HAWKINS,EDWARD Elbert Ewings, MD Primary Cardiologist   Dr. Ladona Ridgel Reason for Consultation  PEA  HPI: Douglas Joseph is a 79 y.o. male with a history of complete heart block, status post permanent pacemaker insertion, hypertension, COPD and chronic renal insufficiency who presented to [facility] after falling out of bed around 2 am. Wife allowed him to remain on the floor and when she woke this am, he was awake but could not get up. She called EMS and in route he became progressively less responsive. On arrival to ER, he was noted to have PEA and was resuscitated after ~5 minutes of CPR and epi x1. Post arrest, he was observed to have seizure like activity that stopped with 1mg  of ativan. Patient was intubated in ER. CT of Head demonstrated no acute bleed but noted a C7 cervical fracture. He was treated with levophed for hypotension and bicarb gtt for acidosis. Transferred to [facility] for further care.   Troponin .14, EKG with diffuse T wave inversions, RBBB, PVCs, and frequent atrial-paced complexes    Past Medical History  Diagnosis Date  . Prostate enlargement   . Hypertension   . Coronary artery disease   . Cardiac pacemaker in situ   . Other and unspecified hyperlipidemia   . Chronic airway obstruction, not elsewhere classified   . Urinary frequency   . Backache, unspecified   . Esophageal reflux   . Chronic kidney disease, stage III (moderate)   . Unspecified constipation   . Hypertrophy of prostate without urinary obstruction and other lower urinary tract symptoms (LUTS)   . Osteoarthrosis, unspecified whether generalized or localized, unspecified site   . Unspecified asthma(493.90)   . Anxiety state, unspecified   . Allergic rhinitis, cause unspecified   . Atrioventricular block, complete   . PEA (Pulseless electrical activity)      Past Surgical  History  Procedure Laterality Date  . Pacemaker insertion      10 yrs ago  . Cervical fusion      cervical neck  . Inguinal hernia repair  11/24/2011    Procedure: HERNIA REPAIR INGUINAL ADULT;  Surgeon: Marlane Hatcher, MD;  Location: [facility];  Service: General;  Laterality: Left;    No Known Allergies  I have reviewed the patient's current medications . aspirin  324 mg Oral NOW   Or  . aspirin  300 mg Rectal NOW  . etomidate      . heparin  5,000 Units Subcutaneous Q8H  . levETIRAcetam  1,000 mg Intravenous Once  . levETIRAcetam  500 mg Intravenous Q12H  . lidocaine (cardiac) 100 mg/78ml      . LORazepam      . LORazepam      . pantoprazole (PROTONIX) IV  40 mg Intravenous QHS  . rocuronium      . sodium bicarbonate       . dextrose 5 % and 0.9% NaCl    . norepinephrine (LEVOPHED) Adult infusion Stopped (12-Oct-2013 1230)  .  sodium bicarbonate  infusion 1000 mL 50 mL/hr at 10-12-2013 1230   sodium chloride, fentaNYL, LORazepam  Prior to Admission medications   Medication Sig Start Date End Date Taking? Authorizing Provider  citalopram (CELEXA) 20 MG tablet Take 20 mg by mouth every morning.     Historical Provider, MD  diltiazem (CARDIZEM CD) 180 MG 24 hr capsule Take  180 mg by mouth every morning.     Historical Provider, MD  dutasteride (AVODART) 0.5 MG capsule Take 0.5 mg by mouth every morning.     Historical Provider, MD  meclizine (ANTIVERT) 25 MG tablet Take 25 mg by mouth 3 (three) times daily as needed.    Historical Provider, MD  omeprazole (PRILOSEC) 20 MG capsule Take 20 mg by mouth every morning. For acid reflux    Historical Provider, MD  senna-docusate (SENOKOT-S) 8.6-50 MG per tablet Take 1 tablet by mouth at bedtime.     Historical Provider, MD  Tamsulosin HCl (FLOMAX) 0.4 MG CAPS Take 0.4 mg by mouth daily.     Historical Provider, MD  tiotropium (SPIRIVA) 18 MCG inhalation capsule Place 18 mcg into inhaler and inhale every morning.    Historical Provider, MD   traMADol-acetaminophen (ULTRACET) 37.5-325 MG per tablet Take 1 tablet by mouth 2 (two) times daily.      Historical Provider, MD     History   Social History  . Marital Status: Married    Spouse Name: N/A    Number of Children: N/A  . Years of Education: N/A   Occupational History  . [employer]    Social History Main Topics  . Smoking status: Former Smoker -- 2.00 packs/day for 20 years    Types: Cigarettes    Quit date: 11/21/1958  . Smokeless tobacco: Not on file  . Alcohol Use: No  . Drug Use: No  . Sexual Activity: Yes    Birth Control/ Protection: None   Other Topics Concern  . Not on file   Social History Narrative   Married   Lives with wife and grandson   10th grade education    Family Status  Relation Status Death Age  . Daughter Deceased 63  . Mother Deceased 68  . Father Deceased 49  . Son Alive   . Daughter Alive   . Daughter Alive    Family History  Problem Relation Age of Onset  . Lung cancer Daughter   . Anesthesia problems Neg Hx   . Hypotension Neg Hx   . Malignant hyperthermia Neg Hx   . Pseudochol deficiency Neg Hx      ROS:  Full 14 point review of systems complete and found to be negative unless listed above.  Physical Exam: Blood pressure 142/114, pulse 87, temperature 97.1 F (36.2 C), temperature source Rectal, resp. rate 29, height 6\' 1"  (1.854 m), weight 163 lb 12.8 oz (74.3 kg), SpO2 100.00%.   General: Frail elderly male in NAD on vent  Neuro: Obtunded, intermittent jerking movements of ext's, pupils 3-86mm  HEENT: OETT, mm pink/moist  Cardiovascular: s1s2 rrr, no m/r/g  Lungs: resp's even/non-labored, lungs bilaterally coarse  Abdomen: Flat, soft, bsx4 active  Musculoskeletal: No acute deformities, C-Collar in place  Skin: Warm/dry, abrasions on LE's from fall / lying  Labs:   Lab Results  Component Value Date   WBC 2.9* 01/30/2012   HGB 13.9 2013-10-07   HCT 41.0 10-07-13   MCV 87.6 01/30/2012   PLT 193 01/30/2012       Recent Labs Lab 07-Oct-2013 0912  NA 139  K 4.7  CL 104  BUN 27*  CREATININE 1.60*  GLUCOSE 190*     Recent Labs  2013-10-07 0911  TROPIPOC 0.14*    Radiology:  Ct Head Wo Contrast  07-Oct-2013   ADDENDUM REPORT: 10-07-2013 11:22  ADDENDUM: Hypodensity right cerebellum (series 3, image 7) probably related to streak  artifact rather than acute infarct. With the cervical spine injury, result of vertebral artery dissection and infarct not entirely excluded as cause for this appearance but felt to be less likely consideration.   Electronically Signed   By: Bridgett Larsson M.D.   On: 08/31/2013 11:22   08/19/2013   CLINICAL DATA:  Fall. Hypertension. Pacemaker. Cardiac arrest. Seizure.  EXAM: CT HEAD WITHOUT CONTRAST  CT CERVICAL SPINE WITHOUT CONTRAST  TECHNIQUE: Multidetector CT imaging of the head and cervical spine was performed following the standard protocol without intravenous contrast. Multiplanar CT image reconstructions of the cervical spine were also generated.  COMPARISON:  None.  FINDINGS: CT HEAD FINDINGS  Mild soft tissue swelling right parietal region. No underlying fracture or intracranial hemorrhage.  Small vessel disease type changes without CT evidence of large acute infarct.  Atrophy without hydrocephalus.  No intracranial mass lesion noted on this unenhanced exam.  Vascular calcifications.  Mucosal thickening maxillary sinuses with evidence of prior sinus surgery. Minimal mucosal thickening ethmoid sinus air cells.  CT CERVICAL SPINE FINDINGS  Complex fracture of the C7 vertebral body with sagittal and oblique component extending from the mid aspect of the vertebral body into the left C7 pedicle which is fractured. Anterior superior C7 vertebral fracture.  Fracture of the right C6 transverse process with fracture fragments displaced anteriorly. This fracture extends through the course of the right vertebral artery which may be injury.  Prior laminectomy from C3 through T1.   Transverse ligament hypertrophy. Erosion of the adjacent dens. C2-3 disc protrusion. Spinal stenosis with cord compression.  Fusion of C3 through C5.  Cervical spondylotic changes C3 through T1 with foraminal narrowing and impression upon the ventral aspect of the cord which is posteriorly displaced.  Fusion of C7 and T1 posterior elements as well as left C6-7 posterior elements. Widening of the right C6-7 facet joint which may be related to the patient's recent fall and/or degenerative changes.  IMPRESSION: Head CT:  Mild soft tissue swelling right parietal region. No underlying fracture or intracranial hemorrhage.  Small vessel disease type changes without CT evidence of large acute infarct.  Atrophy without hydrocephalus.  Cervical spine CT:  Complex fracture of the C7 vertebral body with sagittal and oblique component extending from the mid aspect of the vertebral body into the left C7 pedicle which is fractured. Anterior superior C7 vertebral fracture.  Fracture of the right C6 transverse process with fracture fragments displaced anteriorly. This fracture extends through the course of the right vertebral artery which may be injury.  Prior laminectomy from C3 through T1.  Transverse ligament hypertrophy. Erosion of the adjacent dens. C2-3 disc protrusion. Spinal stenosis with cord compression.  Fusion of C3 through C5.  Cervical spondylotic changes C3 through T1 with foraminal narrowing and impression upon the ventral aspect of the cord which is posteriorly displaced.  Fusion of C7 and T1 posterior elements as well as left C6-7 posterior elements. Widening of the right C6-7 facet joint which may be related to the patient's recent fall and/or degenerative changes.  These results were called by telephone at the time of interpretation on 09/02/2013 at 10:45 AM to Dr. Margarita Grizzle , who verbally acknowledged these results.  Electronically Signed: By: Bridgett Larsson M.D. On: 08/20/2013 11:04   Ct Cervical Spine Wo  Contrast  08/17/2013   ADDENDUM REPORT: 08/28/2013 11:22  ADDENDUM: Hypodensity right cerebellum (series 3, image 7) probably related to streak artifact rather than acute infarct. With the cervical spine injury, result of  vertebral artery dissection and infarct not entirely excluded as cause for this appearance but felt to be less likely consideration.   Electronically Signed   By: Bridgett Larsson M.D.   On: 09-25-13 11:22   09/25/2013   CLINICAL DATA:  Fall. Hypertension. Pacemaker. Cardiac arrest. Seizure.  EXAM: CT HEAD WITHOUT CONTRAST  CT CERVICAL SPINE WITHOUT CONTRAST  TECHNIQUE: Multidetector CT imaging of the head and cervical spine was performed following the standard protocol without intravenous contrast. Multiplanar CT image reconstructions of the cervical spine were also generated.  COMPARISON:  None.  FINDINGS: CT HEAD FINDINGS  Mild soft tissue swelling right parietal region. No underlying fracture or intracranial hemorrhage.  Small vessel disease type changes without CT evidence of large acute infarct.  Atrophy without hydrocephalus.  No intracranial mass lesion noted on this unenhanced exam.  Vascular calcifications.  Mucosal thickening maxillary sinuses with evidence of prior sinus surgery. Minimal mucosal thickening ethmoid sinus air cells.  CT CERVICAL SPINE FINDINGS  Complex fracture of the C7 vertebral body with sagittal and oblique component extending from the mid aspect of the vertebral body into the left C7 pedicle which is fractured. Anterior superior C7 vertebral fracture.  Fracture of the right C6 transverse process with fracture fragments displaced anteriorly. This fracture extends through the course of the right vertebral artery which may be injury.  Prior laminectomy from C3 through T1.  Transverse ligament hypertrophy. Erosion of the adjacent dens. C2-3 disc protrusion. Spinal stenosis with cord compression.  Fusion of C3 through C5.  Cervical spondylotic changes C3 through T1 with  foraminal narrowing and impression upon the ventral aspect of the cord which is posteriorly displaced.  Fusion of C7 and T1 posterior elements as well as left C6-7 posterior elements. Widening of the right C6-7 facet joint which may be related to the patient's recent fall and/or degenerative changes.  IMPRESSION: Head CT:  Mild soft tissue swelling right parietal region. No underlying fracture or intracranial hemorrhage.  Small vessel disease type changes without CT evidence of large acute infarct.  Atrophy without hydrocephalus.  Cervical spine CT:  Complex fracture of the C7 vertebral body with sagittal and oblique component extending from the mid aspect of the vertebral body into the left C7 pedicle which is fractured. Anterior superior C7 vertebral fracture.  Fracture of the right C6 transverse process with fracture fragments displaced anteriorly. This fracture extends through the course of the right vertebral artery which may be injury.  Prior laminectomy from C3 through T1.  Transverse ligament hypertrophy. Erosion of the adjacent dens. C2-3 disc protrusion. Spinal stenosis with cord compression.  Fusion of C3 through C5.  Cervical spondylotic changes C3 through T1 with foraminal narrowing and impression upon the ventral aspect of the cord which is posteriorly displaced.  Fusion of C7 and T1 posterior elements as well as left C6-7 posterior elements. Widening of the right C6-7 facet joint which may be related to the patient's recent fall and/or degenerative changes.  These results were called by telephone at the time of interpretation on 2013-09-25 at 10:45 AM to Dr. Margarita Grizzle , who verbally acknowledged these results.  Electronically Signed: By: Bridgett Larsson M.D. On: 09-25-13 11:04   Dg Chest Port 1 View  09-25-2013   CLINICAL DATA:  Check endotracheal tube placement  EXAM: PORTABLE CHEST - 1 VIEW  COMPARISON:  09/25/13 933 hrs  FINDINGS: Cardiac shadow is stable. A pacing device is again seen. An  endotracheal tube is noted with its tip at  least 6.5 cm above the carina. A a central line is noted via the right subclavian vein but courses into the right internal jugular vein. No pneumothorax is noted. The lungs are clear bilaterally.  IMPRESSION: Endotracheal tube as described.  Right subclavian central line courses into the right internal jugular vein. No pneumothorax is noted.   Electronically Signed   By: Alcide Clever M.D.   On: 08/23/2013 12:42   Dg Chest Portable 1 View  08/27/2013   CLINICAL DATA:  Cardiac arrest.  EXAM: PORTABLE CHEST - 1 VIEW  COMPARISON:  02/05/2012.  FINDINGS: Endotracheal tube tip 3.7 cm above the carina. Sequential pacemaker is in place with leads unchanged in position. Heart size within normal limits.  Pulmonary vascular prominence most notable centrally. Increased markings lung bases may represent pulmonary vascular prominence combined with chronic lung changes although basilar infiltrate difficult to exclude. This can be assessed on follow up.  Tortuous aorta.  No gross pneumothorax.  Lead overlies the proximal descending thoracic aorta.  IMPRESSION: Endotracheal tube tip 3.7 cm above the carina. Sequential pacemaker is in place with leads unchanged in position. Heart size within normal limits.  Pulmonary vascular prominence most notable centrally. Increased markings lung bases may represent pulmonary vascular prominence combined with chronic lung changes although basilar infiltrate difficult to exclude. This can be assessed on follow up.  Tortuous aorta.  Lead overlies the proximal descending thoracic aorta.   Electronically Signed   By: Bridgett Larsson M.D.   On: 08/29/2013 09:48    ASSESSMENT AND PLAN:    Active Problems:   HYPERLIPIDEMIA   HYPERTENSION   COPD   KIDNEY DISEASE, CHRONIC, STAGE III   PACEMAKER-[employer]   Atrioventricular block, complete   Cardiac arrest  PEA arrest-- EKG with ST depression, diffuse T wave inversions, RBBB, PVCs, and frequent  atrial-pacing spikes -- Abnormal EKG and positive troponin in the setting of CPR -- Will interrogate pacemaker, last interrogated in June with normal function   Signed: Thereasa Parkin, PA-C 09/03/2013 12:51 PM  I have seen, examined the patient, and reviewed the above assessment and plan.  Changes to above are made where necessary.  Pt found down for an prolonged period of time for unclear etiology.  Subsequently had PEA arrest at an outside hospital.  His PPM is interrogated by me and reveals atrial flutter/ afib presently with some intrinsic conduction.  His pacemaker function is normal.  He did have 9 seconds of NSVT rate 150-183 bpm this am, though I do not think that this is the cause of his presentation.  Given afib, stroke could be a potential cause of his event.  He has a known h/o afib for which he has not previously been anticoagulated.  Neurology is presently on board and managing status epilepticus.  His ST depression and elevated troponin are likely sequela of his arrest and not a primary event.  He is presently not a candidate for cardiac procedures.  His prognosis is very poor.  Palliative measures may be reasonable if he does not begin to make recovery soon.  Co Sign: Hillis Range, MD 08/17/2013 1:41 PM

## 2013-09-12 NOTE — Progress Notes (Signed)
Prolonged EEG initiated. 

## 2013-09-12 NOTE — ED Notes (Signed)
Pt has multiple abrasion to face and feet

## 2013-09-13 ENCOUNTER — Inpatient Hospital Stay (HOSPITAL_COMMUNITY): Payer: Medicare Other

## 2013-09-13 ENCOUNTER — Other Ambulatory Visit (HOSPITAL_COMMUNITY): Payer: Medicare Other

## 2013-09-13 LAB — POCT I-STAT 3, ART BLOOD GAS (G3+)
Acid-base deficit: 8 mmol/L — ABNORMAL HIGH (ref 0.0–2.0)
Bicarbonate: 22 mEq/L (ref 20.0–24.0)
Bicarbonate: 21.3 mEq/L (ref 20.0–24.0)
TCO2: 24 mmol/L (ref 0–100)
pCO2 arterial: 57.7 mmHg (ref 35.0–45.0)
pCO2 arterial: 62.1 mmHg (ref 35.0–45.0)
pH, Arterial: 7.188 — CL (ref 7.350–7.450)
pH, Arterial: 7.143 — CL (ref 7.350–7.450)
pO2, Arterial: 47 mmHg — ABNORMAL LOW (ref 80.0–100.0)
pO2, Arterial: 46 mmHg — ABNORMAL LOW (ref 80.0–100.0)

## 2013-09-13 LAB — TROPONIN I: Troponin I: 0.58 ng/mL (ref <0.30)

## 2013-09-13 LAB — CBC
HCT: 33.7 % — ABNORMAL LOW (ref 39.0–52.0)
Hemoglobin: 10.7 g/dL — ABNORMAL LOW (ref 13.0–17.0)
MCH: 29 pg (ref 26.0–34.0)
MCHC: 31.8 g/dL (ref 30.0–36.0)

## 2013-09-13 LAB — BASIC METABOLIC PANEL
BUN: 24 mg/dL — ABNORMAL HIGH (ref 6–23)
Calcium: 8.5 mg/dL (ref 8.4–10.5)
GFR calc non Af Amer: 29 mL/min — ABNORMAL LOW (ref >90)
Glucose, Bld: 74 mg/dL (ref 70–99)

## 2013-09-14 MED FILL — Medication: Qty: 1 | Status: AC

## 2013-09-14 NOTE — ED Notes (Signed)
Ativan 1 mg was wasted, and witnessed by Hollie Salk.

## 2013-09-15 DIAGNOSIS — 419620001 Death: Secondary | SNOMED CT | POA: Diagnosis not present

## 2013-09-15 NOTE — Progress Notes (Signed)
Pt. sats dropped into the low 80's and RT bagged pt. To lift sats. RT increased fio2 to 80% to maintain pt. sats.

## 2013-09-15 NOTE — Progress Notes (Signed)
This nurse speaks to Dr. Molli Knock regarding decreasing  B/P, and Levophed order not to be titrated past 21mcg/min. No new orders

## 2013-09-15 NOTE — Progress Notes (Signed)
Pt assessed. No audible or palpable heart rate, blood pressure or lung sounds. Witnessed by 2 RN's, all lines removed and extubated. Post mortem care given.

## 2013-09-15 NOTE — Progress Notes (Signed)
This nurse speaks to patients wife regarding change in status. Wife informs this nurse that she will not be coming to hospital tonight, and she will come back later in the morning.

## 2013-09-15 NOTE — Progress Notes (Signed)
eLink Physician-Brief Progress Note Patient Name: Douglas Joseph DOB: [birth year] MRN: [id number]  Date of Service  09/09/2013   HPI/Events of Note  Desating  eICU Interventions  Increased PEEP and FIO2, CXR ordered      [name] 09/05/2013, 4:35 AM

## 2013-09-15 DEATH — deceased

## 2013-09-16 NOTE — Discharge Summary (Signed)
Name: Douglas MediateRobert Joseph MRN: [id number] DOB: 07/06/1925    ADMISSION DATE:  13-Aug-2013  REFERRING MD :  Dr. Rosalia Hammersay - EDP PRIMARY SERVICE: PCCM  CHIEF COMPLAINT:  Cardiac Arrest  BRIEF PATIENT DESCRIPTION: 80 y/o M admitted 12/29 post fall at home with cardiac arrest in ER (<5 min CPR).  CT Neck with C7 Cervical fx.  [facility]   SIGNIFICANT EVENTS / STUDIES:  12/29 - admit post fall at home with C7 cervical fx, cardiac arrest in ER requiring brief CPR (asystole)  LINES / TUBES: OETT 12/29>>> R [place] TLC 12/29>>>  CULTURES: Sputum 12/29>>>  ANTIBIOTICS:   HISTORY OF PRESENT ILLNESS:  80 y/o M with PMH of CAD s/p pacemaker, HTN, HLD, COPD, GERD, BPH and hx of cervical fusion who presented to [facility] after falling out of bed around 2 am.  Wife reports pt stated "he was okay" and she allowed him to lay on the floor.  When she woke this am, he was still on the floor and awake but could not get up.  She activated EMS and in route he became less responsive.  On arrival to ER, he was noted to have loss of pulse and required approx 5 minutes of CPR and epi x1 with return of spontaneous circulation.  Post arrest, he was observed to have seizure like activity that stopped with 1mg  of ativan.  Pt was intubated in ER.   CT of Head demonstrated no acute bleed but noted a C7 cervical fracture. Pt treated with levophed for hypotension and bicarb gtt for acidosis.  [facility]   PAST MEDICAL HISTORY :  Past Medical History  Diagnosis Date  . Prostate enlargement   . Hypertension   . Coronary artery disease   . Cardiac pacemaker in situ   . Other and unspecified hyperlipidemia   . Chronic airway obstruction, not elsewhere classified   . Urinary frequency   . Backache, unspecified   . Esophageal reflux   . Chronic kidney disease, stage III (moderate)   . Unspecified constipation   . Hypertrophy of prostate without urinary obstruction and other lower urinary tract symptoms (LUTS)    . Osteoarthrosis, unspecified whether generalized or localized, unspecified site   . Unspecified asthma(493.90)   . Anxiety state, unspecified   . Allergic rhinitis, cause unspecified   . Atrioventricular block, complete   . PEA (Pulseless electrical activity)    Past Surgical History  Procedure Laterality Date  . Pacemaker insertion      10 yrs ago  . Cervical fusion      cervical neck  . Inguinal hernia repair  11/24/2011    Procedure: HERNIA REPAIR INGUINAL ADULT;  Surgeon: Marlane HatcherWilliam S Bradford, MD;  Location: [facility];  Service: General;  Laterality: Left;   Prior to Admission medications   Medication Sig Start Date End Date Taking? Authorizing Provider  citalopram (CELEXA) 20 MG tablet Take 20 mg by mouth every morning.     Historical Provider, MD  diltiazem (CARDIZEM CD) 180 MG 24 hr capsule Take 180 mg by mouth every morning.     Historical Provider, MD  dutasteride (AVODART) 0.5 MG capsule Take 0.5 mg by mouth every morning.     Historical Provider, MD  meclizine (ANTIVERT) 25 MG tablet Take 25 mg by mouth 3 (three) times daily as needed.    Historical Provider, MD  omeprazole (PRILOSEC) 20 MG capsule Take 20 mg by mouth every morning. For acid reflux  Historical Provider, MD  senna-docusate (SENOKOT-S) 8.6-50 MG per tablet Take 1 tablet by mouth at bedtime.     Historical Provider, MD  Tamsulosin HCl (FLOMAX) 0.4 MG CAPS Take 0.4 mg by mouth daily.     Historical Provider, MD  tiotropium (SPIRIVA) 18 MCG inhalation capsule Place 18 mcg into inhaler and inhale every morning.    Historical Provider, MD  traMADol-acetaminophen (ULTRACET) 37.5-325 MG per tablet Take 1 tablet by mouth 2 (two) times daily.      Historical Provider, MD   No Known Allergies  COURSE -  PULMONARY A: Acute Respiratory Failure - in setting of cardiac arrest P:   -full vent support, 8 cc / kg   CARDIOVASCULAR A:  Cardiac Arrest - initial rhythm asystole, < 5 min CPR CAD s/p  Pacemaker Hypotension Aflutter -fibn on PPM interrogation - 9 seconds of NSVT but doubt as cause of arrest ST depression and elevated troponin - likely sequela of his arrest and not a primary event  P:  -not cooling candidate with short arrest period -Cardiology Consult -consider pacemaker assessment -ASA -levophed gtt for MAP >65  RENAL A:   Acute Kidney Injury - likely component rhabo given prolonged period of lying on floor Metabolic Acidosis  P:   -hydration -bicarb gtt at 75 -dc once pH improves > 7.30  INFECTIOUS A:   R/O Aspiration - in setting of AMS & unknown period of downtime P:   -sputum culture -follow fever curve / leukocytosis   NEUROLOGIC A:   R/O Seizure vs Myoclonus - prolonged period on floor after fall with C7 fx Acute Encephalopathy C7 Cervical Fx Hx of Cervical Fusion with cervical stenosis P:   -PRN ativan -keppra load, dosing per pharmacy -Neurology & NSGY consult -not a candidate for aggressive interventions -assess EEG -minimize sedation as able -cervical collar for stabilization  Inspite of our efforts, he passed away within 24 hours of ICU admission  Cause of death - Acute resp failure, CAD, Hypertension  ALVA,RAKESH V.   09/16/2013, 5:43 PM

## 2014-08-24 ENCOUNTER — Encounter (HOSPITAL_COMMUNITY): Payer: Self-pay | Admitting: Internal Medicine

## 2015-01-21 IMAGING — CR DG CHEST 1V PORT
1 series · 1 of 1 positions shown · non-contrast
Comparison: 09/12/2013.

CLINICAL DATA: Central line placement.

EXAM:
PORTABLE CHEST - 1 VIEW

[AP]
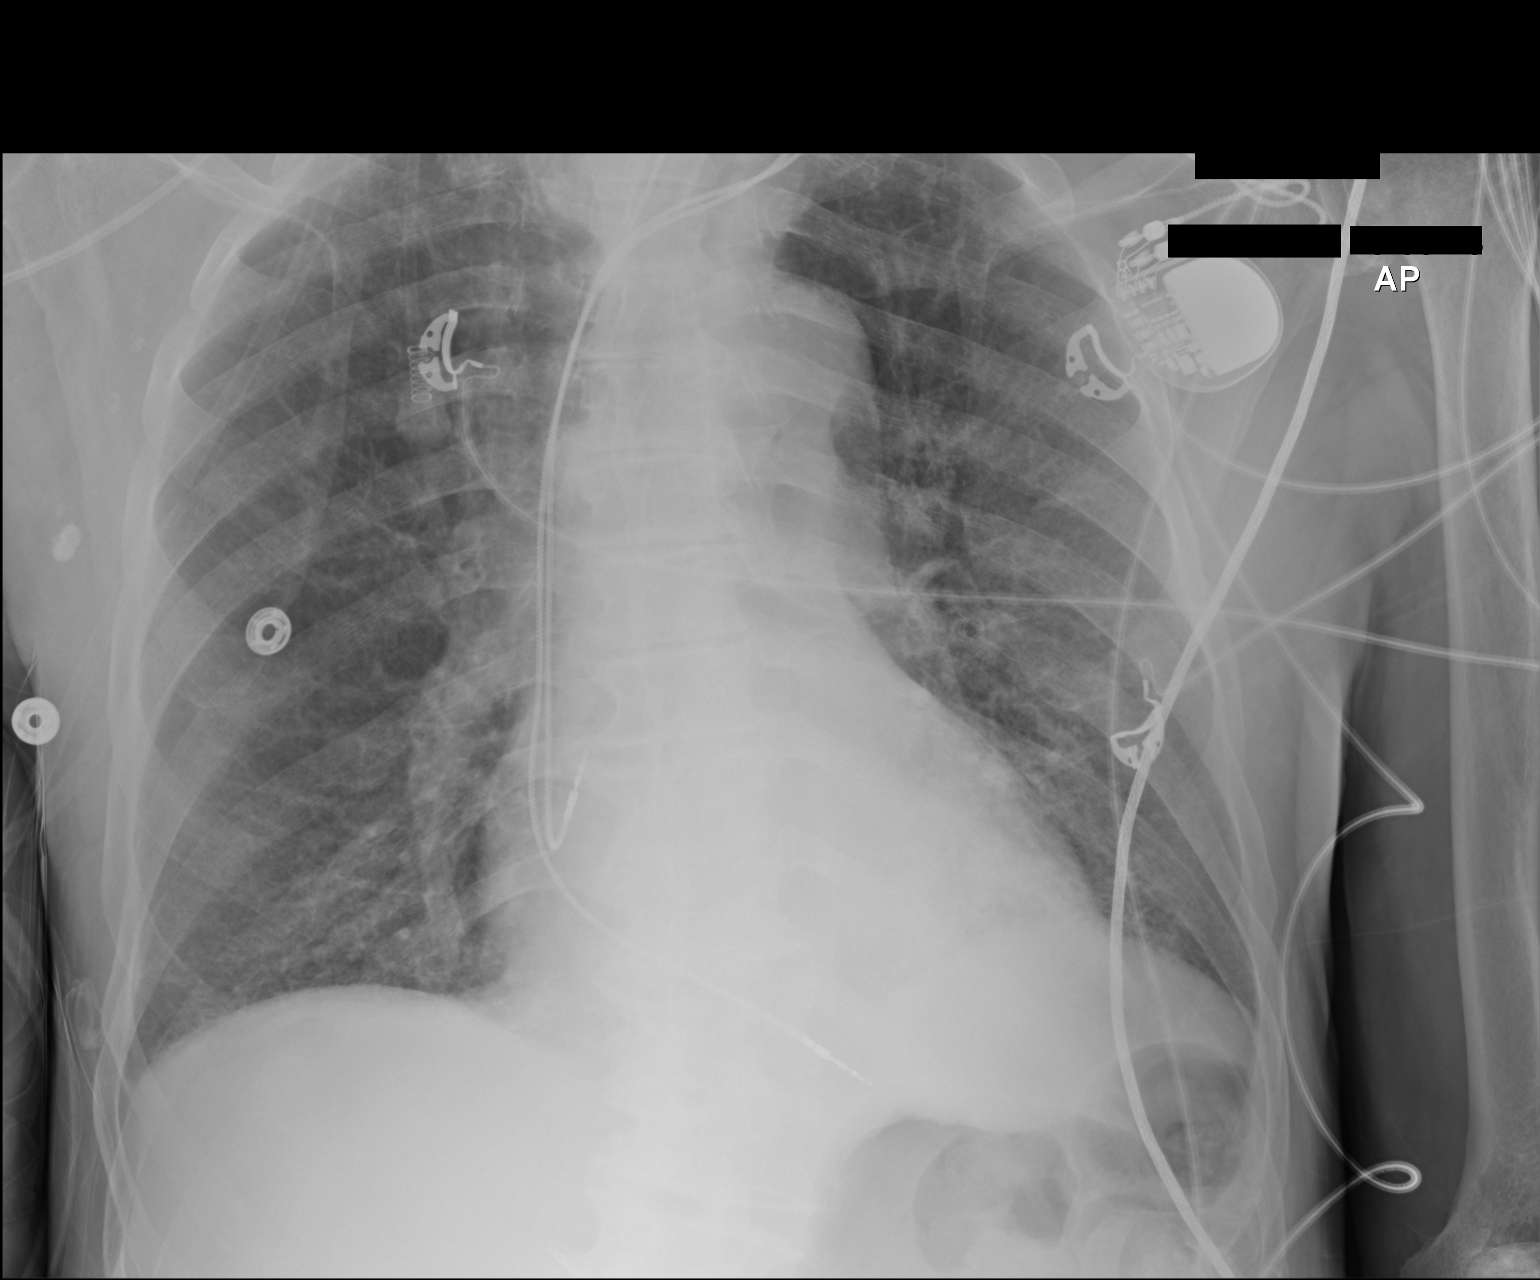

[1 of 1 positions shown; findings below may reference images not displayed]

FINDINGS: Right subclavian line is again noted coursing superiorly.
Endotracheal tube tip in stable position. Cardiac pacer noted with
lead tips in right atrium and right ventricle. Cardiomegaly. No
pulmonary venous congestion. Lungs are clear. No pleural effusion or
pneumothorax.
IMPRESSION: 1. Right subclavian central line again noted coursing superiorly
into the neck.
2. Endotracheal tube in stable position. Stable cardiomegaly. No
acute cardiopulmonary disease.

## 2015-01-21 IMAGING — DX DG CHEST 1V PORT
1 series · 1 of 1 positions shown · non-contrast
Comparison: 09/12/2013 [DATE] hrs

CLINICAL DATA: Check endotracheal tube placement

EXAM:
PORTABLE CHEST - 1 VIEW

[portable]
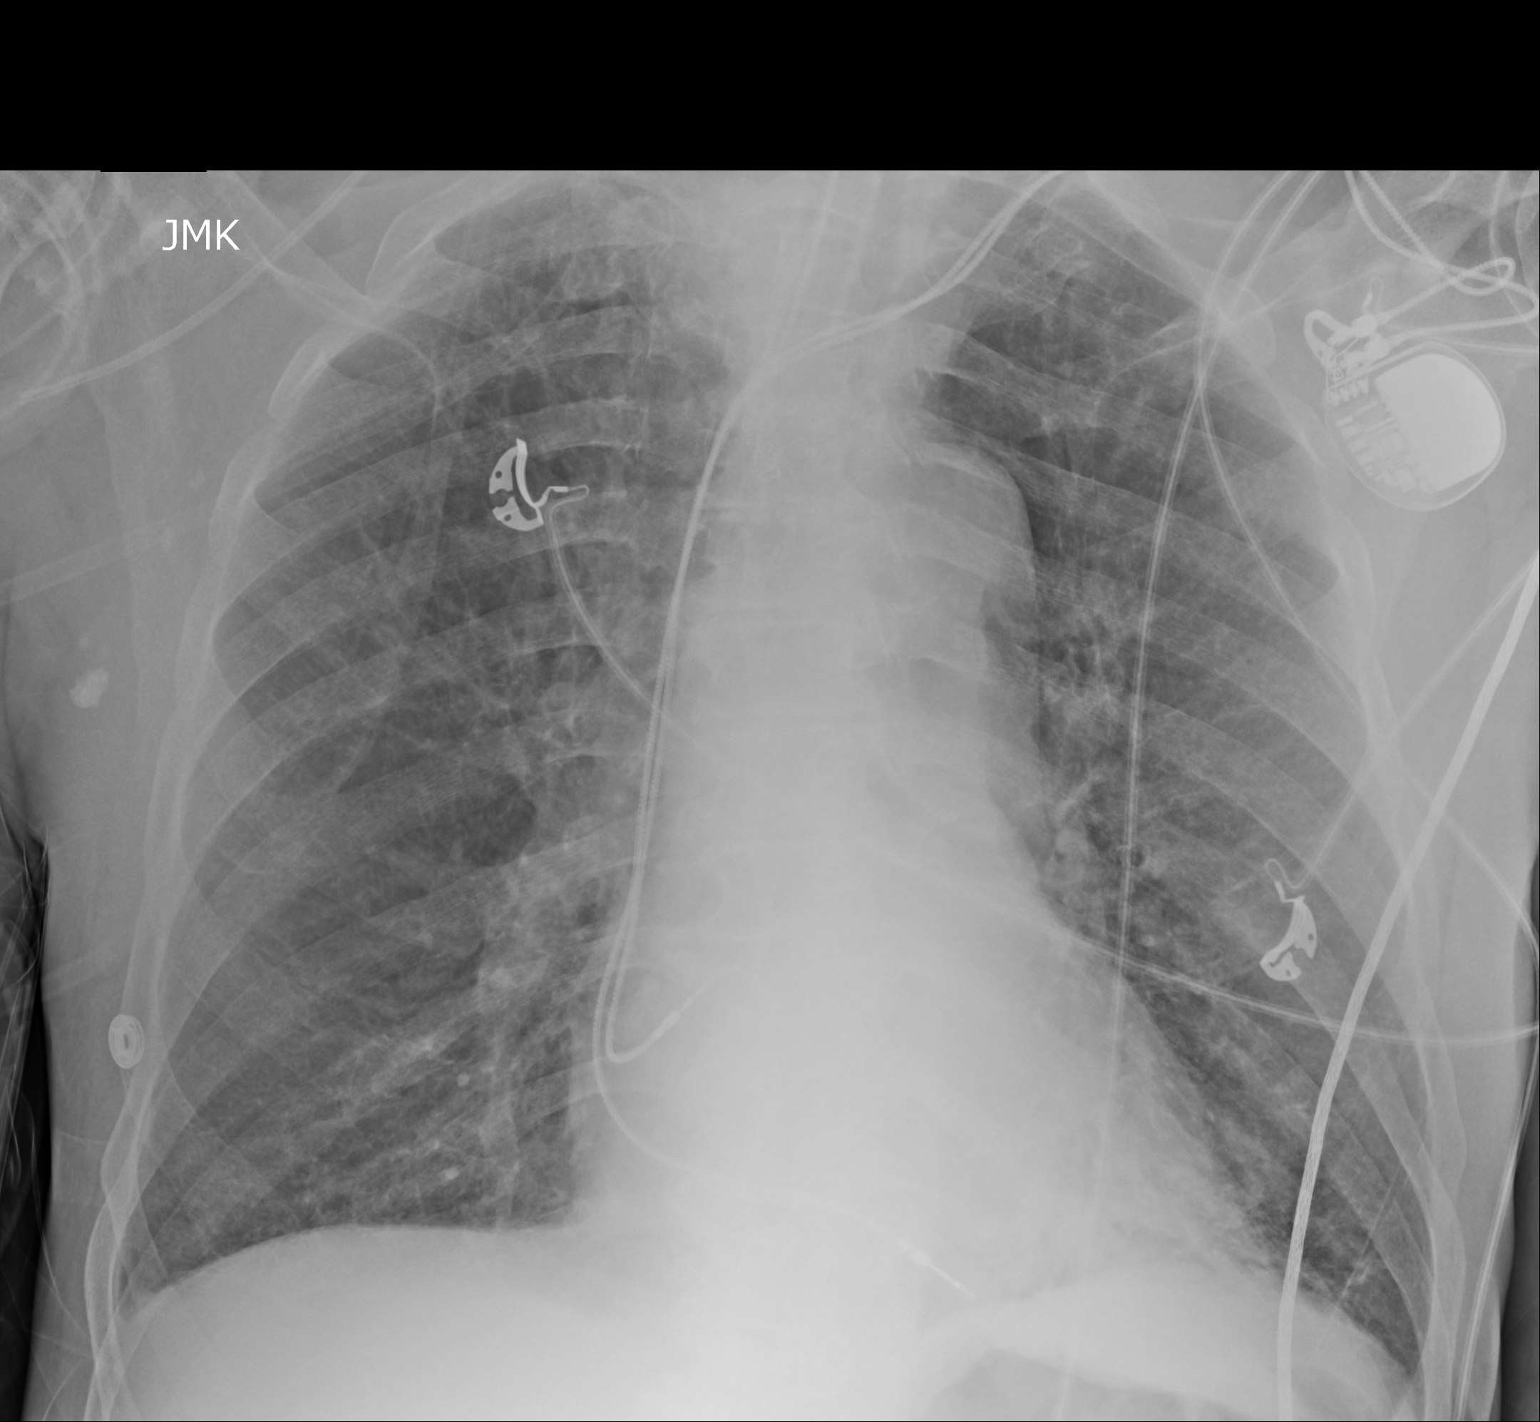

[1 of 1 positions shown; findings below may reference images not displayed]

FINDINGS: Cardiac shadow is stable. A pacing device is again seen. An
endotracheal tube is noted with its tip at least 6.5 cm above the
carina. A a central line is noted via the right subclavian vein but
courses into the right internal jugular vein. No pneumothorax is
noted. The lungs are clear bilaterally.
IMPRESSION: Endotracheal tube as described.

Right subclavian central line courses into the right internal
jugular vein. No pneumothorax is noted.

## 2015-01-22 IMAGING — CT CT HEAD W/O CM
2 series · 15 of 30 positions shown, 19 images · non-contrast
Comparison: CT of the head September 12, 2013 at [DATE] a.m.

CLINICAL DATA: Anoxic brain injury.

EXAM:
CT HEAD WITHOUT CONTRAST
TECHNIQUE: Contiguous axial images were obtained from the base of the skull
through the vertex without intravenous contrast.

[Series 3: head w/o · axial · non-contrast · 0.49mm/px · z∈[+107,+242]mm · 13 of 33 slices shown, 17 images]
[im 3/33  brain]
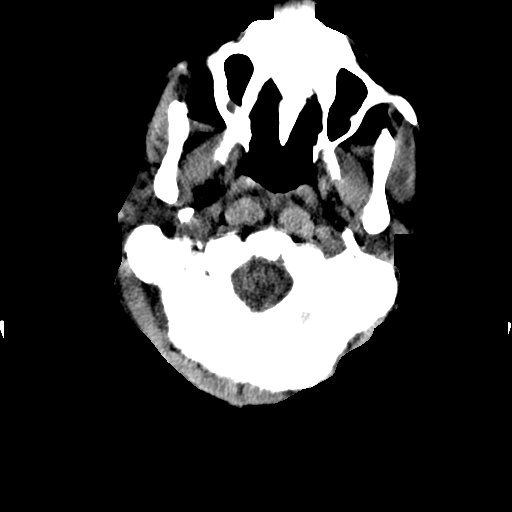
[im 3/33  bone]
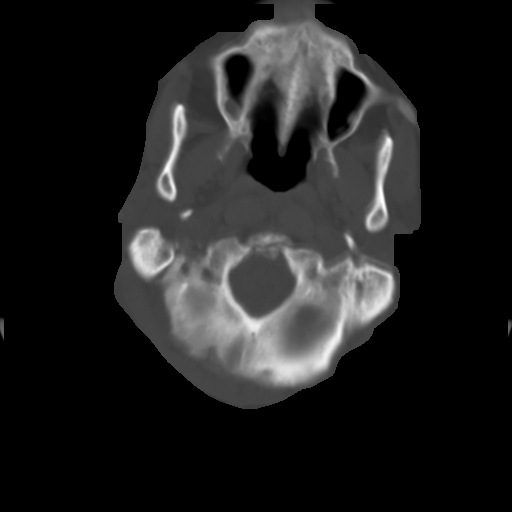
[im 5/33  brain]
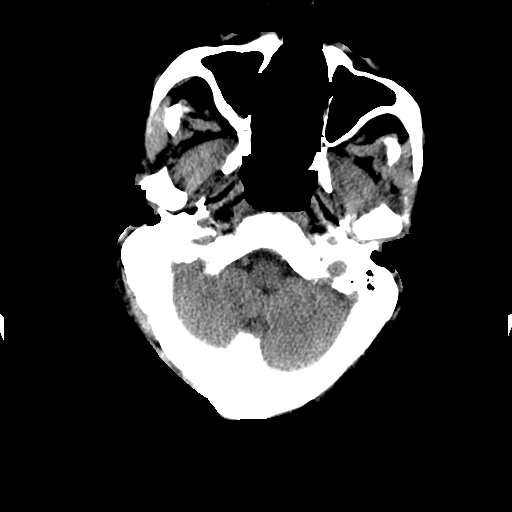
[im 7/33  brain]
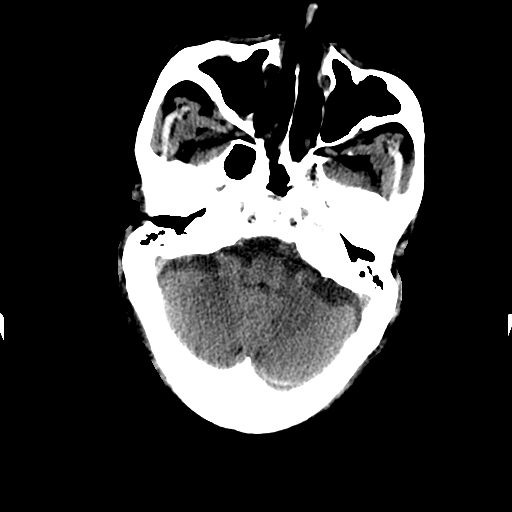
[im 10/33  brain]
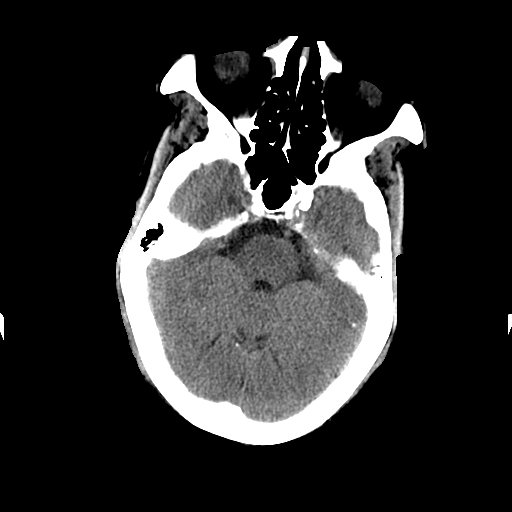
[im 12/33  brain]
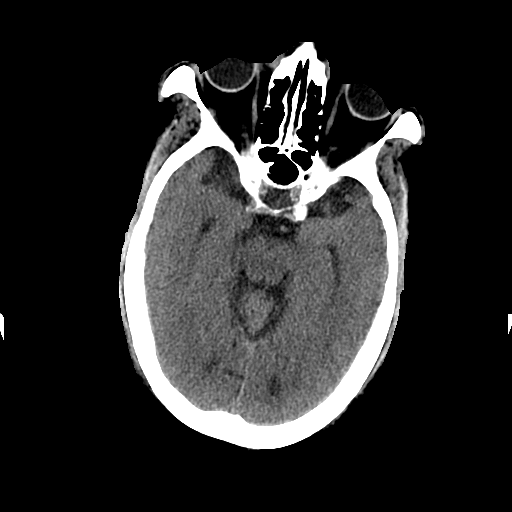
[im 12/33  bone]
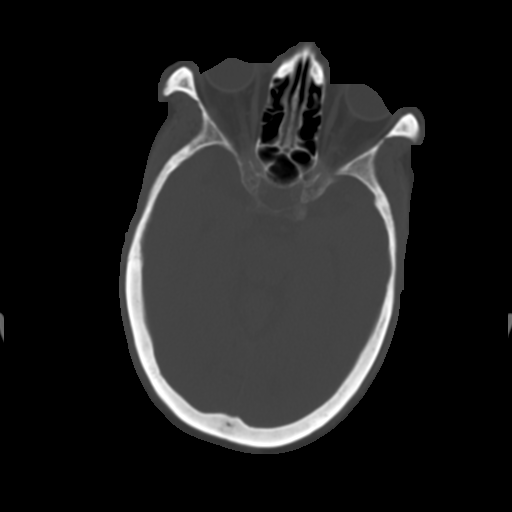
[im 14/33  brain]
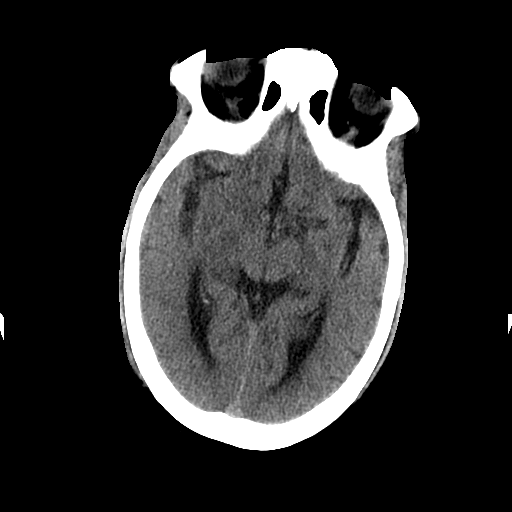
[im 17/33  brain]
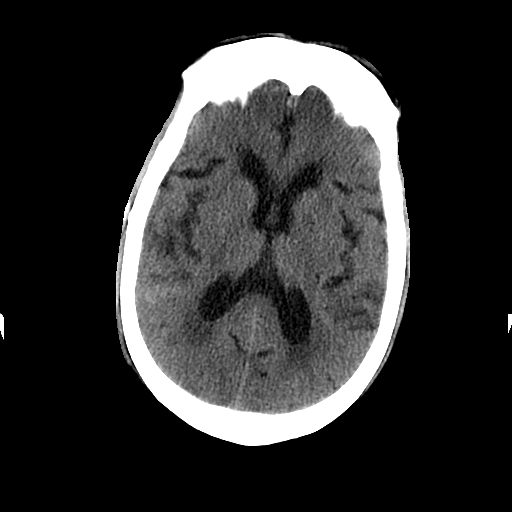
[im 19/33  brain]
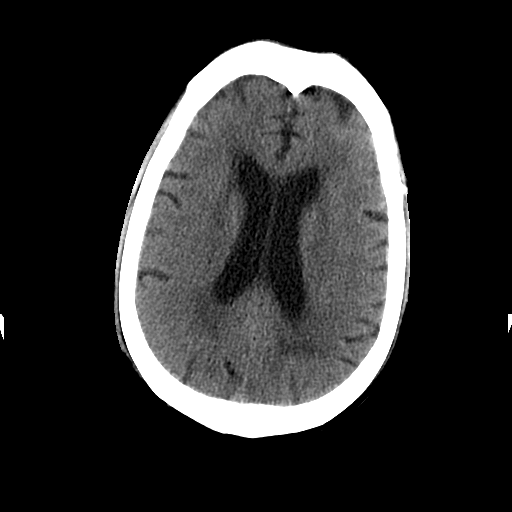
[im 21/33  brain]
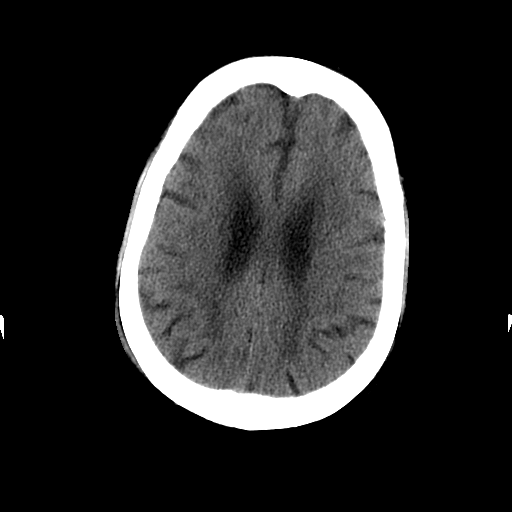
[im 21/33  bone]
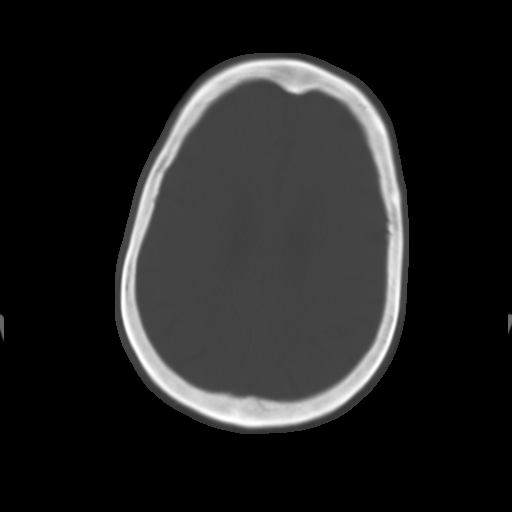
[im 23/33  brain]
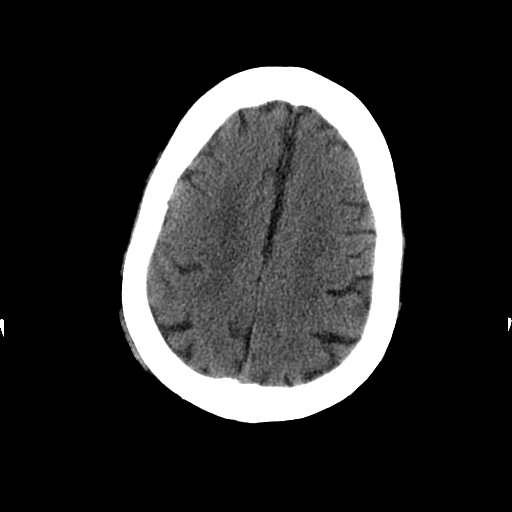
[im 26/33  brain]
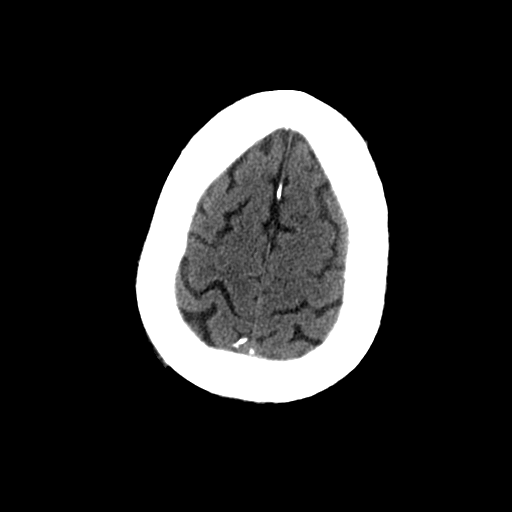
[im 28/33  brain]
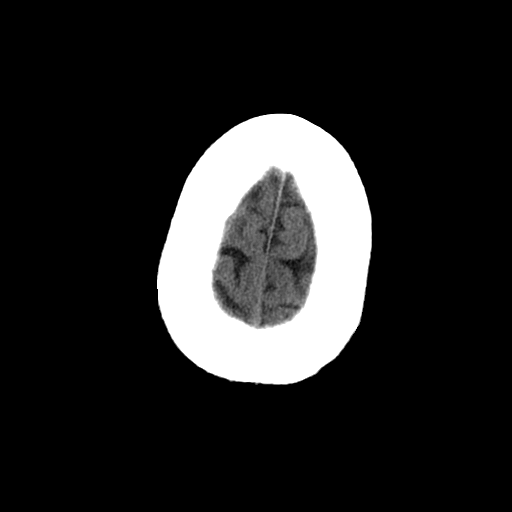
[im 30/33  brain]
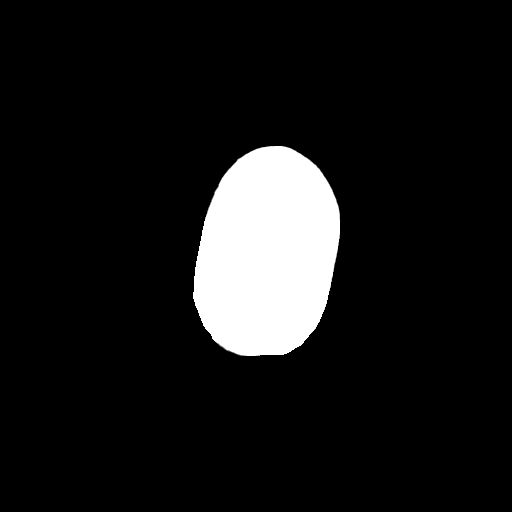
[im 30/33  bone]
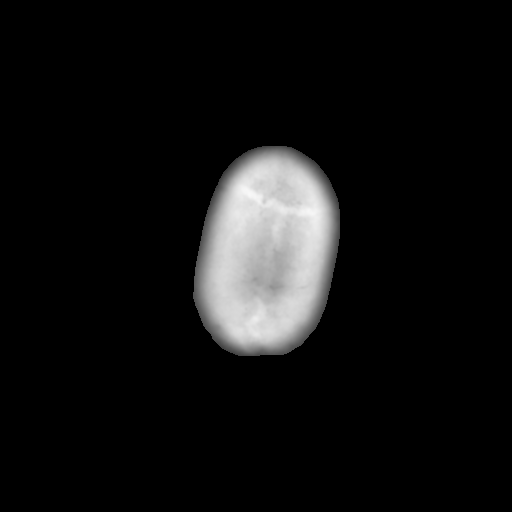

[Series 4: head w/o bone · axial · non-contrast · 0.49mm/px · z∈[+107,+127]mm · 2 of 33 slices shown]
[im 3/33  bone]
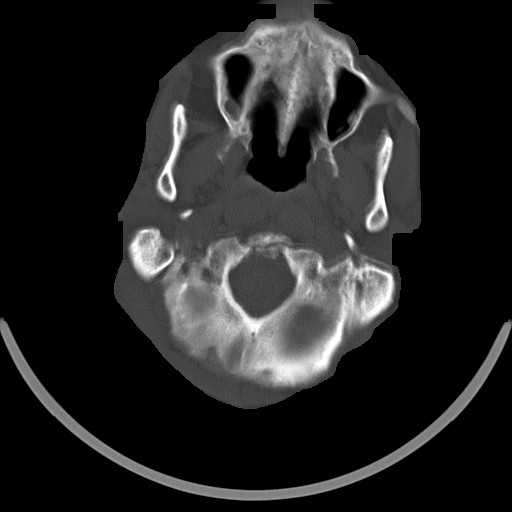
[im 7/33  bone]
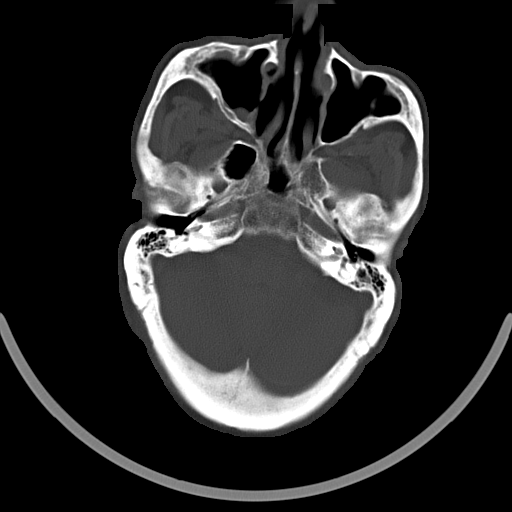

[15 of 30 positions shown; findings below may reference images not displayed]

FINDINGS: Mild motion degraded examination. The ventricles and sulci are
normal for age. No intraparenchymal hemorrhage, mass effect nor
midline shift. Patchy supratentorial white matter hypodensities are
within normal range for patient's age and though non-specific
suggest sequelae of chronic small vessel ischemic disease. No acute
large vascular territory infarcts. No convincing evidence of
abnormal hypodensity within the cerebellum

No abnormal extra-axial fluid collections. Basal cisterns are
patent. Moderate calcific atherosclerosis of the carotid siphons.

No skull fracture. Small residual right parietal scalp hematoma.
Mild chronic right maxillary sinusitis without paranasal sinus
air-fluid levels. Moderate temporomandibular osteoarthrosis. The
included ocular globes and orbital contents are non-suspicious.
IMPRESSION: Mild motion degraded examination. No acute intracranial process.
Small residual right parietal scalp hematoma without skull fracture.

Involutional changes. Moderate white matter changes suggest chronic
small vessel ischemic disease.

  By: Kretos Mox
